# Patient Record
Sex: Female | Born: 1950 | Race: White | Hispanic: No | Marital: Single | Smoking: Never smoker
Health system: Southern US, Community
[De-identification: ages and names within clinical notes are randomized; demographics above are authoritative.]

## PROBLEM LIST (undated history)

## (undated) DIAGNOSIS — Z789 Other specified health status: Secondary | ICD-10-CM

## (undated) DIAGNOSIS — I8390 Asymptomatic varicose veins of unspecified lower extremity: Secondary | ICD-10-CM

## (undated) HISTORY — PX: COLONOSCOPY: SHX174

---

## 1998-06-21 HISTORY — PX: TONSILLECTOMY: SUR1361

## 2007-08-23 ENCOUNTER — Ambulatory Visit: Payer: Self-pay | Admitting: Vascular Surgery

## 2007-09-08 ENCOUNTER — Ambulatory Visit: Payer: Self-pay | Admitting: Vascular Surgery

## 2007-11-23 ENCOUNTER — Ambulatory Visit: Payer: Self-pay | Admitting: Vascular Surgery

## 2009-08-19 LAB — HM MAMMOGRAPHY

## 2010-11-03 NOTE — Consult Note (Signed)
NEW PATIENT CONSULTATION   Abigail Miller, Abigail Miller  DOB:  Apr 12, 1951                                       08/23/2007  ZOXWR#:[id number]   Abigail Miller presents today for evaluation of pain in her right leg.  She reports complex throbbing pain and also itching in her right leg.  She does have some superficial reticular veins over her right calf ankle  and pretibial area.  She reports that the itching is specifically  related to the area of these veins.  She reports that she first noticed  venous pathology after her third pregnancy 19 years ago.  She does have  some aching sensation associated with this from her right knee distally.  She does not have any similar symptoms in her left leg.  She has no  significant swelling or edema.  She does not have any history of deep  venous thrombosis or any hemorrhage from her veins.   MEDICAL HISTORY:  Significant for:  1. Skin cancer behind her right ear which was resected.  2. She has no history of major medical difficulties, specifically no      diabetes, hypertension, elevated cholesterol or cardiovascular      disorders.  3. She has had cesarean section x3.   Her only medication is Prempro.  She has no known drug allergies.   SOCIAL HISTORY:  1. She is married with 3 children.  2. She works as a Doctor, general practice.  3. She does not smoke or drink alcohol.   REVIEW OF SYSTEMS:  Is otherwise negative.   PHYSICAL EXAM:  She is a well-developed, well-nourished white female  appearing stated age of 15.  Her blood pressure is 146/92, heart rate is  84, respirations 18.  Her dorsalis pedis pulses are 2+ bilaterally.  General:  She is a thin, white female in no acute distress.  She does  have spider vein telangiectasia bilaterally and does have reticular  varicosities over the pretibial area on the right.  She underwent a  handheld screening Duplex by me and this did not show any obvious gross  reflux in her saphenous vein.  I  have recommended a formal Duplex for  further evaluation.  She was not able to wait to have this done today in  our office, so we will reschedule this at her convenience and I will see  her again at that time to discuss this.  I did explain options with her  depending on the results of the ultrasound.  We will make further  recommendations, pending this study, on her next visit.   Larina Earthly, M.D.  Electronically Signed   TFE/MEDQ  D:  08/23/2007  T:  08/24/2007  Job:  [id number]   cc:   Amy Y. [place], M.D.

## 2010-11-03 NOTE — Procedures (Signed)
LOWER EXTREMITY VENOUS REFLUX EXAM   INDICATION:  Right calf varicose veins.  Left leg is asymptomatic at  this time.   EXAM:  Using color-flow imaging and pulse Doppler spectral analysis, the  right and left common femoral, superficial femoral, popliteal, posterior  tibial, greater and lesser saphenous veins are evaluated.  There is no  evidence suggesting deep venous insufficiency in the right and left  lower extremity.   The right and left saphenofemoral junctions are competent.  The right  and left GSV are competent.   The right proximal short saphenous vein demonstrates incompetence.  Left  short saphenous vein was competent.   GSV Diameter (used if found to be incompetent only)                                            Right    Left  Proximal Greater Saphenous Vein           cm       cm  Proximal-to-mid-thigh                     cm       cm  Mid thigh                                 cm       cm  Mid-distal thigh                          cm       cm  Distal thigh                              cm       cm  Knee                                      cm       cm   IMPRESSION:  1. No greater saphenous vein reflux is identified.  2. The deep venous system is competent.  3. The right short saphenous vein is not competent.  4. The left short saphenous vein is competent.   ___________________________________________  Larina Earthly, M.D.   DP/MEDQ  D:  09/08/2007  T:  09/08/2007  Job:  [id number]

## 2010-11-03 NOTE — Assessment & Plan Note (Signed)
OFFICE VISIT   Abigail Miller, Abigail Miller  DOB:  [birth year]                                       09/08/2007  ZOXWR#:[id number]   Ulyana Pitones presents today for continued followup of her venous  difficulties.  I have seen her for initial evaluation on 08/23/2007.  She is here today with bilateral venous duplex for further evaluation of  her vascular anatomy.  On my screening ultrasound, at the time of her  initial visit, I did not see any evidence of gross saphenous vein  reflux.  She continues to have difficulty with mainly two components of  her discomfort.  She has total leg throbbing and does report some  numbness in some positions of sleeping in her right leg only.  She also  has severe itching over the distal calf down to her ankle.  This is in  an area of a large amount of spider vein telangiectasia.  I reviewed her  venous duplex with her, this shows no evidence of great saphenous vein  reflux, her right small saphenous vein is also competent; she does have  some reflux in her left small saphenous vein.  Her deep venous system  shows no evidence of obstruction or incompetence.  I discussed this at  length with Ms. Muenchow.  I have no explanation for her right total leg  throbbing and aching sensation.  I suspect that this could be related to  either neurogenic or orthopedic causes.  Regarding the itching over the  telangiectasia, I explained that it is highly unusual to see this.  She  reports that the itching and the telangiectasia appeared at the same  time, it is only in this region.  I feel it is reasonable to proceed  with sclerotherapy to determine if she can have some relief of this.  I  described the procedure as an outpatient procedure.  I explained that  insurance would not cover this and explained the approximate cost for  this as well.  She wishes to proceed with this at her convenience.   Larina Earthly, M.D.  Electronically Signed   TFE/MEDQ  D:   09/08/2007  T:  09/11/2007  Job:  [id number]   cc:   Amy Y. [place], M.D.

## 2011-11-26 ENCOUNTER — Other Ambulatory Visit: Payer: Self-pay | Admitting: Orthopedic Surgery

## 2011-11-29 ENCOUNTER — Encounter (HOSPITAL_BASED_OUTPATIENT_CLINIC_OR_DEPARTMENT_OTHER): Payer: Self-pay | Admitting: *Deleted

## 2011-11-29 NOTE — Progress Notes (Signed)
No labs needed No PCP

## 2011-12-03 ENCOUNTER — Encounter (HOSPITAL_BASED_OUTPATIENT_CLINIC_OR_DEPARTMENT_OTHER): Payer: Self-pay | Admitting: *Deleted

## 2011-12-03 ENCOUNTER — Encounter (HOSPITAL_BASED_OUTPATIENT_CLINIC_OR_DEPARTMENT_OTHER): Payer: Self-pay | Admitting: Certified Registered Nurse Anesthetist

## 2011-12-03 ENCOUNTER — Encounter (HOSPITAL_BASED_OUTPATIENT_CLINIC_OR_DEPARTMENT_OTHER): Payer: Self-pay | Admitting: Orthopedic Surgery

## 2011-12-03 ENCOUNTER — Ambulatory Visit (HOSPITAL_BASED_OUTPATIENT_CLINIC_OR_DEPARTMENT_OTHER)
Admission: RE | Admit: 2011-12-03 | Discharge: 2011-12-03 | Disposition: A | Discharge status: Hospice | Payer: BC Managed Care – PPO | Source: Ambulatory Visit | Attending: Orthopedic Surgery | Admitting: Orthopedic Surgery

## 2011-12-03 ENCOUNTER — Ambulatory Visit (HOSPITAL_BASED_OUTPATIENT_CLINIC_OR_DEPARTMENT_OTHER): Payer: BC Managed Care – PPO | Admitting: Certified Registered Nurse Anesthetist

## 2011-12-03 ENCOUNTER — Encounter (HOSPITAL_BASED_OUTPATIENT_CLINIC_OR_DEPARTMENT_OTHER)
Admission: RE | Disposition: A | Discharge status: Hospice | Payer: Self-pay | Source: Ambulatory Visit | Attending: Orthopedic Surgery

## 2011-12-03 DIAGNOSIS — G5600 Carpal tunnel syndrome, unspecified upper limb: Secondary | ICD-10-CM | POA: Insufficient documentation

## 2011-12-03 DIAGNOSIS — M65839 Other synovitis and tenosynovitis, unspecified forearm: Secondary | ICD-10-CM | POA: Insufficient documentation

## 2011-12-03 DIAGNOSIS — M654 Radial styloid tenosynovitis [de Quervain]: Secondary | ICD-10-CM | POA: Insufficient documentation

## 2011-12-03 HISTORY — PX: TRIGGER FINGER RELEASE: SHX641

## 2011-12-03 HISTORY — DX: Other specified health status: Z78.9

## 2011-12-03 HISTORY — PX: DORSAL COMPARTMENT RELEASE: SHX5039

## 2011-12-03 HISTORY — DX: Asymptomatic varicose veins of unspecified lower extremity: I83.90

## 2011-12-03 HISTORY — PX: CARPAL TUNNEL RELEASE: SHX101

## 2011-12-03 SURGERY — CARPAL TUNNEL RELEASE
Anesthesia: General | Site: Wrist | Laterality: Right | Wound class: Clean

## 2011-12-03 MED ORDER — ONDANSETRON HCL 4 MG/2ML IJ SOLN
INTRAMUSCULAR | Status: DC | PRN
  Administered 2011-12-03: 4 mg via INTRAVENOUS

## 2011-12-03 MED ORDER — FENTANYL CITRATE 0.05 MG/ML IJ SOLN
INTRAMUSCULAR | Status: DC | PRN
  Administered 2011-12-03: 50 ug via INTRAVENOUS
  Administered 2011-12-03: 25 ug via INTRAVENOUS

## 2011-12-03 MED ORDER — FENTANYL CITRATE 0.05 MG/ML IJ SOLN
25.0000 ug | INTRAMUSCULAR | Status: DC | PRN
  Administered 2011-12-03 (×2): 25 ug via INTRAVENOUS
  Administered 2011-12-03: 50 ug via INTRAVENOUS

## 2011-12-03 MED ORDER — OXYCODONE HCL 5 MG PO TABS: 5.0000 mg | ORAL_TABLET | Freq: Once | ORAL | Status: DC | PRN

## 2011-12-03 MED ORDER — LACTATED RINGERS IV SOLN
INTRAVENOUS | Status: DC
  Administered 2011-12-03 (×2): via INTRAVENOUS

## 2011-12-03 MED ORDER — CHLORHEXIDINE GLUCONATE 4 % EX LIQD: 60.0000 mL | Freq: Once | CUTANEOUS | Status: DC

## 2011-12-03 MED ORDER — OXYCODONE-ACETAMINOPHEN 5-325 MG PO TABS: ORAL_TABLET | ORAL | Status: AC

## 2011-12-03 MED ORDER — LIDOCAINE HCL (CARDIAC) 20 MG/ML IV SOLN
INTRAVENOUS | Status: DC | PRN
  Administered 2011-12-03: 60 mg via INTRAVENOUS

## 2011-12-03 MED ORDER — PROPOFOL 10 MG/ML IV EMUL
INTRAVENOUS | Status: DC | PRN
  Administered 2011-12-03: 200 mg via INTRAVENOUS

## 2011-12-03 MED ORDER — METOCLOPRAMIDE HCL 5 MG/ML IJ SOLN
INTRAMUSCULAR | Status: DC | PRN
  Administered 2011-12-03: 10 mg via INTRAVENOUS

## 2011-12-03 MED ORDER — DEXAMETHASONE SODIUM PHOSPHATE 10 MG/ML IJ SOLN
INTRAMUSCULAR | Status: DC | PRN
  Administered 2011-12-03: 10 mg via INTRAVENOUS

## 2011-12-03 MED ORDER — LIDOCAINE HCL 2 % IJ SOLN
INTRAMUSCULAR | Status: DC | PRN
  Administered 2011-12-03: 5 mL

## 2011-12-03 MED ORDER — METOCLOPRAMIDE HCL 5 MG/ML IJ SOLN: 10.0000 mg | Freq: Once | INTRAMUSCULAR | Status: DC | PRN

## 2011-12-03 MED ORDER — MIDAZOLAM HCL 5 MG/5ML IJ SOLN
INTRAMUSCULAR | Status: DC | PRN
  Administered 2011-12-03: 2 mg via INTRAVENOUS

## 2011-12-03 MED ORDER — OXYCODONE-ACETAMINOPHEN 5-325 MG PO TABS
1.0000 | ORAL_TABLET | Freq: Once | ORAL | Status: AC
  Administered 2011-12-03: 1 via ORAL

## 2011-12-03 SURGICAL SUPPLY — 50 items
BANDAGE ADHESIVE 1X3 (GAUZE/BANDAGES/DRESSINGS) IMPLANT
BANDAGE ELASTIC 3 VELCRO ST LF (GAUZE/BANDAGES/DRESSINGS) ×4 IMPLANT
BLADE MINI RND TIP GREEN BEAV (BLADE) IMPLANT
BLADE SURG 15 STRL LF DISP TIS (BLADE) ×3 IMPLANT
BLADE SURG 15 STRL SS (BLADE) ×1
BNDG ELASTIC 2 VLCR STRL LF (GAUZE/BANDAGES/DRESSINGS) ×4 IMPLANT
BNDG ESMARK 4X9 LF (GAUZE/BANDAGES/DRESSINGS) ×4 IMPLANT
BRUSH SCRUB EZ PLAIN DRY (MISCELLANEOUS) ×4 IMPLANT
CLOTH BEACON ORANGE TIMEOUT ST (SAFETY) ×4 IMPLANT
CORDS BIPOLAR (ELECTRODE) ×4 IMPLANT
COVER MAYO STAND STRL (DRAPES) ×4 IMPLANT
COVER TABLE BACK 60X90 (DRAPES) ×4 IMPLANT
CUFF TOURNIQUET SINGLE 18IN (TOURNIQUET CUFF) ×4 IMPLANT
DECANTER SPIKE VIAL GLASS SM (MISCELLANEOUS) ×4 IMPLANT
DRAPE EXTREMITY T 121X128X90 (DRAPE) ×4 IMPLANT
DRAPE SURG 17X23 STRL (DRAPES) ×4 IMPLANT
DRSG TEGADERM 4X4.75 (GAUZE/BANDAGES/DRESSINGS) IMPLANT
GAUZE SPONGE 4X4 12PLY STRL LF (GAUZE/BANDAGES/DRESSINGS) ×8 IMPLANT
GAUZE XEROFORM 1X8 LF (GAUZE/BANDAGES/DRESSINGS) ×4 IMPLANT
GLOVE BIO SURGEON STRL SZ7 (GLOVE) ×8 IMPLANT
GLOVE BIOGEL M STRL SZ7.5 (GLOVE) IMPLANT
GLOVE BIOGEL PI IND STRL 7.5 (GLOVE) ×6 IMPLANT
GLOVE BIOGEL PI INDICATOR 7.5 (GLOVE) ×2
GLOVE ORTHO TXT STRL SZ7.5 (GLOVE) ×4 IMPLANT
GOWN PREVENTION PLUS XLARGE (GOWN DISPOSABLE) ×4 IMPLANT
GOWN PREVENTION PLUS XXLARGE (GOWN DISPOSABLE) ×8 IMPLANT
GOWN STRL REIN XL XLG (GOWN DISPOSABLE) ×8 IMPLANT
NEEDLE 27GAX1X1/2 (NEEDLE) ×4 IMPLANT
PACK BASIN DAY SURGERY FS (CUSTOM PROCEDURE TRAY) ×4 IMPLANT
PAD CAST 3X4 CTTN HI CHSV (CAST SUPPLIES) ×3 IMPLANT
PAD CAST 4YDX4 CTTN HI CHSV (CAST SUPPLIES) ×3 IMPLANT
PADDING CAST ABS 4INX4YD NS (CAST SUPPLIES) ×1
PADDING CAST ABS COTTON 4X4 ST (CAST SUPPLIES) ×3 IMPLANT
PADDING CAST COTTON 3X4 STRL (CAST SUPPLIES) ×1
PADDING CAST COTTON 4X4 STRL (CAST SUPPLIES) ×1
SLEEVE SCD COMPRESS KNEE MED (MISCELLANEOUS) IMPLANT
SPLINT PLASTER CAST XFAST 3X15 (CAST SUPPLIES) ×15 IMPLANT
SPLINT PLASTER XTRA FASTSET 3X (CAST SUPPLIES) ×5
SPONGE GAUZE 4X4 12PLY (GAUZE/BANDAGES/DRESSINGS) ×4 IMPLANT
STOCKINETTE 4X48 STRL (DRAPES) ×4 IMPLANT
STRIP CLOSURE SKIN 1/2X4 (GAUZE/BANDAGES/DRESSINGS) ×4 IMPLANT
SUT PROLENE 3 0 PS 2 (SUTURE) ×4 IMPLANT
SUT VIC AB 4-0 P-3 18XBRD (SUTURE) IMPLANT
SUT VIC AB 4-0 P3 18 (SUTURE)
SYR 3ML 23GX1 SAFETY (SYRINGE) IMPLANT
SYR CONTROL 10ML LL (SYRINGE) ×4 IMPLANT
TOWEL OR 17X24 6PK STRL BLUE (TOWEL DISPOSABLE) ×4 IMPLANT
TRAY DSU PREP LF (CUSTOM PROCEDURE TRAY) ×4 IMPLANT
UNDERPAD 30X30 INCONTINENT (UNDERPADS AND DIAPERS) ×4 IMPLANT
WATER STERILE IRR 1000ML POUR (IV SOLUTION) ×4 IMPLANT

## 2011-12-03 NOTE — Anesthesia Postprocedure Evaluation (Signed)
Anesthesia Post Note  Patient: Abigail Miller  Procedure(s) Performed: Procedure(s) (LRB): CARPAL TUNNEL RELEASE (Right) RELEASE TRIGGER FINGER/A-1 PULLEY (Right) RELEASE DORSAL COMPARTMENT (DEQUERVAIN) (Right)  Anesthesia type: General  Patient location: PACU  Post pain: Pain level controlled  Post assessment: Patient's Cardiovascular Status Stable  Last Vitals:  Filed Vitals:   12/03/11 1055  BP: 135/80  Pulse:   Temp: 36.4 C  Resp: 14    Post vital signs: Reviewed and stable  Level of consciousness: alert  Complications: No apparent anesthesia complications

## 2011-12-03 NOTE — H&P (Signed)
Abigail Miller is an 61 y.o. female.   Chief Complaint: numb right hand, locking thumb and mass with pain radial wrist HPI: chronic numbness right greater than left with positive NCV. sts of thumb and first dorsal compartment  Past Medical History  Diagnosis Date  . No pertinent past medical history   . Vein, varicose     Past Surgical History  Procedure Date  . Colonoscopy   . Tonsillectomy   . Cesarean section     x3    History reviewed. No pertinent family history. Social History:  reports that she has never smoked. She does not have any smokeless tobacco history on file. She reports that she does not drink alcohol or use illicit drugs.  Allergies:  Allergies  Allergen Reactions  . Sulfa Antibiotics Rash    Medications Prior to Admission  Medication Sig Dispense Refill  . acetaminophen (TYLENOL) 325 MG tablet Take 650 mg by mouth every 6 (six) hours as needed.        No results found for this or any previous visit (from the past 48 hour(s)). No results found.  Review of Systems  Constitutional: Negative.   HENT: Negative.   Eyes: Negative.   Respiratory: Negative.   Cardiovascular: Negative.   Gastrointestinal: Negative.   Genitourinary: Negative.   Musculoskeletal: Positive for joint pain.  Skin: Negative.   Neurological: Positive for tingling.  Endo/Heme/Allergies: Negative.   Psychiatric/Behavioral: Negative.     Blood pressure 146/98, pulse 96, temperature 98.4 F (36.9 C), temperature source Oral, resp. rate 16, height 5\' 3"  (1.6 m), weight 54.432 kg (120 lb), SpO2 96.00%. Physical Exam  Constitutional: She appears well-developed and well-nourished.  HENT:  Head: Normocephalic and atraumatic.  Eyes: Pupils are equal, round, and reactive to light.  Neck: Normal range of motion. Neck supple.  Cardiovascular: Normal rate and regular rhythm.   Respiratory: Effort normal and breath sounds normal.  GI: Soft.  Musculoskeletal: Normal range of motion.    Swelling at right first dorsal compartment Locking right thumb and chronic numbness median distribution  Skin: Skin is warm.  Psychiatric: She has a normal mood and affect.     Assessment/Plan right carpal tunnel syndrome Right first dorsal compartment sts and thumb sts  Right carpal tunnel release , trigger thumb release and first dorsal compartment release Terriona Horlacher JR,Kimble Hitchens V 12/03/2011, 8:26 AM

## 2011-12-03 NOTE — Transfer of Care (Signed)
Immediate Anesthesia Transfer of Care Note  Patient: Abigail Miller  Procedure(s) Performed: Procedure(s) (LRB): CARPAL TUNNEL RELEASE (Right) RELEASE TRIGGER FINGER/A-1 PULLEY (Right) RELEASE DORSAL COMPARTMENT (DEQUERVAIN) (Right)  Patient Location: PACU  Anesthesia Type: General  Level of Consciousness: awake and patient cooperative  Airway & Oxygen Therapy: Patient Spontanous Breathing and Patient connected to face mask oxygen  Post-op Assessment: Report given to PACU RN and Post -op Vital signs reviewed and stable  Post vital signs: Reviewed and stable  Complications: No apparent anesthesia complications

## 2011-12-03 NOTE — Brief Op Note (Signed)
12/03/2011  9:21 AM  PATIENT:  Prescott Parma  61 y.o. female  PRE-OPERATIVE DIAGNOSIS:  right carpal tunnel syndrome, trigger thumb, dequervains  POST-OPERATIVE DIAGNOSIS:  same as preop  PROCEDURE:  Procedure(s) (LRB): CARPAL TUNNEL RELEASE (Right) RELEASE TRIGGER THUMB/A-1 PULLEY  RIGHT THUMB RELEASE DORSAL COMPARTMENT (DEQUERVAIN) (Right)  SURGEON:  Surgeon(s) and Role:    * Wyn Forster., MD - Primary  PHYSICIAN ASSISTANT:   ASSISTANTS: NURSE   ANESTHESIA:   general  EBL:  Total I/O In: 1000 [I.V.:1000] Out: -   BLOOD ADMINISTERED:none  DRAINS: none   LOCAL MEDICATIONS USED:  LIDOCAINE   SPECIMEN:  No Specimen  DISPOSITION OF SPECIMEN:  N/A  COUNTS:  YES  TOURNIQUET:   Total Tourniquet Time Documented: Upper Arm (Right) - 24 minutes  DICTATION: .Other Dictation: Dictation Number [id number]  PLAN OF CARE: Discharge to home after PACU  PATIENT DISPOSITION:  PACU - hemodynamically stable.

## 2011-12-03 NOTE — Op Note (Signed)
[id number] 

## 2011-12-03 NOTE — Anesthesia Procedure Notes (Signed)
Procedure Name: LMA Insertion Date/Time: 12/03/2011 8:45 AM Performed by: Burke Terry D Pre-anesthesia Checklist: Patient identified, Emergency Drugs available, Suction available and Patient being monitored Patient Re-evaluated:Patient Re-evaluated prior to inductionOxygen Delivery Method: Circle System Utilized Preoxygenation: Pre-oxygenation with 100% oxygen Intubation Type: IV induction Ventilation: Mask ventilation without difficulty LMA: LMA inserted LMA Size: 4.0 Number of attempts: 1 Airway Equipment and Method: bite block Placement Confirmation: positive ETCO2 Tube secured with: Tape Dental Injury: Teeth and Oropharynx as per pre-operative assessment

## 2011-12-03 NOTE — Op Note (Signed)
NAMEJHANE, LORIO NO.:  [id number]  MEDICAL RECORD NO.:  [id number]  LOCATION:                                 FACILITY:  PHYSICIAN:  Katy Fitch. Amiera Herzberg, M.D.      DATE OF BIRTH:  DATE OF PROCEDURE:  12/03/2011 DATE OF DISCHARGE:                              OPERATIVE REPORT   PREOPERATIVE DIAGNOSES: 1. Entrapped neuropathy, median nerve, right carpal tunnel, right     first dorsal compartment stenosing tenosynovitis with separate     compartment for extensor pollicis brevis. 2. Chronic stenosing tenosynovitis, right thumb with     fibrocartilaginous nodule, flexor pollicis longus.  OPERATION: 1. Release of right first dorsal compartment with septum resection. 2. Release of right thumb A1 pulley with removal of fibrocartilaginous     nodule from flexor pollicis longus. 3. Release of right transcarpal ligament.  OPERATING SURGEON:  Katy Fitch. Einar Nolasco, M.D.  ASSISTANT:  Nurse.  ANESTHESIA:  General by LMA.  SUPERVISING ANESTHESIOLOGIST:  Janetta Hora. 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  Clinical examination confirmed the presence of carpal tunnel syndrome, first dorsal compartment stenosing tenosynovitis and trigger thumb.  She has failed nonoperative measures including splinting activity modification and steroid injection.  She is now devised to present to the operating room for release of her transverse carpal ligament, release of the first dorsal compartment, and release of the thumb A1 pulley.  She was reexamined in the holding area and the pathology confirmed.  Questions were invited and answered in detail.  PROCEDURE:  Casaundra Takacs is brought to room #1 of the [facility], placed in supine position on the operating  table.  Following induction of general anesthesia by LMA technique, the right arm was prepped with Betadine soap and solution, sterilely draped.  Following exsanguination of right arm with Esmarch bandage, the arterial tourniquet was inflated to 220 mmHg.  The procedure commenced with a short incision in the line of the ring finger and the palm. Subcutaneous tissues were carefully divided revealing the palmar fascia. This was split longitudinally to reveal the common sensory branch of the median nerve.  These were followed back to the median nerve proper which was then gently isolated from the transverse carpal ligament with the aid of a [place] 4 Engineer, structural.  A pathway was created superficial to the nerve deep to the ligament into the distal forearm.  The ligament was then released along its ulnar border with scissors extending into the distal forearm.  This widely opened carpal canal.  No mass or other predicaments were noted.  Bleeding points along the margin of released ligament were not problematic.  The wound was then repaired with intradermal 3-0 Prolene suture.  Attention was then directed to the thumb.  A short transverse incision was placed directly over the palpably thickened A1 pulley.  Subcutaneous tissues were quite fibrotic.  These releases scissors revealing a very swollen A1 pulley.  This was split with scalpel and scissors.  The flexor  pollicis longus was delivered and a fibrocartilaginous nodule measuring approximately 4 mm in length, 2 mm in width and 2 mm in height was excised with a micro rongeur.  Thereafter free range of motion of the thumb IP joint was recovered.  This wound was then repaired with intradermal 3-0 Prolene suture.  Attention was then directed to the radial aspect of the wrist.  A short transverse incision was fashioned directly over the palpably thickened first dorsal compartment.  Subcutaneous tissues were carefully divided revealing steroid  within the wall of the compartment from prior injection.  This was removed with a rongeur.  The radial superficial sensory branches were gently dissected and retracted with Ragnell retractors.  The A1 pulley was cleared of all soft tissues, released with scalpel and scissors.  There were 2 slips of the abductor pollicis longus and an extensor pollicis brevis that had a separate dorsal compartment.  The compartment was incised and the septum removed. Thereafter the wound was repaired with intradermal 3-0 Prolene.  All 3 wounds were infiltrated with 2% lidocaine for postoperative comfort.  Steri-Strips were applied.  Compressive dressing was applied with a volar plaster splint maintaining the wrist in 15 degrees of dorsiflexion.  No apparent complications.  For aftercare, Ms. Lecount was provided with prescription for Percocet 5 mg 1 p.o. q.4-6 hours p.r.n. pain, 20 tablets, without refill.  We will see her back in followup in 1 week for dressing change, suture removal, and advancement to an exercise program.     Katy Fitch. Amor Hyle, M.D.     RVS/MEDQ  D:  12/03/2011  T:  12/03/2011  Job:  [id number]

## 2011-12-03 NOTE — Anesthesia Preprocedure Evaluation (Signed)
Anesthesia Evaluation  Patient identified by MRN, date of birth, ID band Patient awake    Reviewed: Allergy & Precautions, H&P , NPO status , Patient's Chart, lab work & pertinent test results, reviewed documented beta blocker date and time   Airway Mallampati: II TM Distance: >3 FB Neck ROM: full    Dental   Pulmonary neg pulmonary ROS,          Cardiovascular negative cardio ROS      Neuro/Psych negative neurological ROS  negative psych ROS   GI/Hepatic negative GI ROS, Neg liver ROS,   Endo/Other  negative endocrine ROS  Renal/GU negative Renal ROS  negative genitourinary   Musculoskeletal   Abdominal   Peds  Hematology negative hematology ROS (+)   Anesthesia Other Findings See surgeon's H&P   Reproductive/Obstetrics negative OB ROS                           Anesthesia Physical Anesthesia Plan  ASA: I  Anesthesia Plan: General   Post-op Pain Management:    Induction: Intravenous  Airway Management Planned: LMA  Additional Equipment:   Intra-op Plan:   Post-operative Plan: Extubation in OR  Informed Consent: I have reviewed the patients History and Physical, chart, labs and discussed the procedure including the risks, benefits and alternatives for the proposed anesthesia with the patient or authorized representative who has indicated his/her understanding and acceptance.   Dental Advisory Given  Plan Discussed with: CRNA and Surgeon  Anesthesia Plan Comments:         Anesthesia Quick Evaluation  

## 2011-12-03 NOTE — Discharge Instructions (Signed)
HAND SURGERY ° °  HOME CARE INSTRUCTIONS ° ° ° °The following instructions have been prepared to help you care for yourself upon your return home today. ° °Wound Care:  °Keep your hand elevated above the level of your heart. Do not allow it to dangle by your side. Keep the dressing dry and do not remove it unless your doctor advises you to do so. He will usually change it at the time of you post-op visit. Moving your fingers is advised to stimulate circulation but will depend on the site of your surgery. Of course, if you have a splint applied your doctor will advise you about movement. ° °Activity:  °Do not drive or operate machinery today. Rest today and then you may return to your normal activity and work as indicated by your physician. ° °Diet: °Drink liquids today or eat a light diet. You may resume a regular diet tomorrow. ° °General expectations: °Pain for two or three days. °Fingers may become slightly swollen.  ° °Unexpected Observations- Call your doctor if any of these occur: °Severe pain not relieved by pain medication. °Elevated temperature. °Dressing soaked with blood. °Inability to move fingers. °White or bluish color to fingers. ° ° °Post Anesthesia Home Care Instructions ° °Activity: °Get plenty of rest for the remainder of the day. A responsible adult should stay with you for 24 hours following the procedure.  °For the next 24 hours, DO NOT: °-Drive a car °-Operate machinery °-Drink alcoholic beverages °-Take any medication unless instructed by your physician °-Make any legal decisions or sign important papers. ° °Meals: °Start with liquid foods such as gelatin or soup. Progress to regular foods as tolerated. Avoid greasy, spicy, heavy foods. If nausea and/or vomiting occur, drink only clear liquids until the nausea and/or vomiting subsides. Call your physician if vomiting continues. ° °Special Instructions/Symptoms: °Your throat may feel dry or sore from the anesthesia or the breathing tube  placed in your throat during surgery. If this causes discomfort, gargle with warm salt water. The discomfort should disappear within 24 hours. ° ° ° ° °

## 2011-12-08 ENCOUNTER — Encounter (HOSPITAL_BASED_OUTPATIENT_CLINIC_OR_DEPARTMENT_OTHER): Payer: Self-pay | Admitting: Orthopedic Surgery

## 2012-10-16 ENCOUNTER — Ambulatory Visit (HOSPITAL_BASED_OUTPATIENT_CLINIC_OR_DEPARTMENT_OTHER)
Admission: RE | Admit: 2012-10-16 | Discharge: 2012-10-16 | Disposition: A | Discharge status: Home / Self Care | Payer: BC Managed Care – PPO | Source: Ambulatory Visit | Attending: Family Medicine | Admitting: Family Medicine

## 2012-10-16 ENCOUNTER — Encounter: Payer: Self-pay | Admitting: Family Medicine

## 2012-10-16 ENCOUNTER — Ambulatory Visit (INDEPENDENT_AMBULATORY_CARE_PROVIDER_SITE_OTHER): Payer: BC Managed Care – PPO | Admitting: Family Medicine

## 2012-10-16 VITALS — BP 120/80 | HR 112 | Temp 98.3°F | Ht 63.0 in | Wt 127.8 lb

## 2012-10-16 DIAGNOSIS — W19XXXA Unspecified fall, initial encounter: Secondary | ICD-10-CM | POA: Insufficient documentation

## 2012-10-16 DIAGNOSIS — M25572 Pain in left ankle and joints of left foot: Secondary | ICD-10-CM

## 2012-10-16 DIAGNOSIS — M25579 Pain in unspecified ankle and joints of unspecified foot: Secondary | ICD-10-CM

## 2012-10-16 DIAGNOSIS — Y929 Unspecified place or not applicable: Secondary | ICD-10-CM | POA: Insufficient documentation

## 2012-10-16 MED ORDER — TRAMADOL HCL 50 MG PO TABS: 50.0000 mg | ORAL_TABLET | Freq: Three times a day (TID) | ORAL | Status: DC | PRN

## 2012-10-16 NOTE — Progress Notes (Signed)
  Subjective:    Abigail Miller is a 62 y.o. female who presents with left ankle pain. Onset of the symptoms was several days ago. Inciting event: pt stepped in a hole while running with the dog. Current symptoms include: bruising, inability to bear weight and swelling. Aggravating factors: direct pressure, standing, walking  and weight bearing. Symptoms have gradually worsened. Patient has had no prior ankle problems. Evaluation to date: none. Treatment to date: none. The following portions of the patient's history were reviewed and updated as appropriate: allergies, current medications, past family history, past medical history, past social history, past surgical history and problem list.    Objective:    BP 120/80  Pulse 112  Temp(Src) 98.3 F (36.8 C) (Oral)  Ht 5\' 3"  (1.6 m)  Wt 127 lb 12.8 oz (57.97 kg)  BMI 22.64 kg/m2  SpO2 96% Right ankle:   normal  Left ankle:   ecchymosis noted across metatarsal phlanges, and ant low leg negative findings: no erythema positive findings: decreased dorsiflexion, tenderness lat malleous and up low leg   Imaging: X-ray of the left ankle(s): not available    Assessment:    Ankle sprain    Plan:    Natural history and expected course discussed. Questions answered. Rest, ice, compression, elevation (RICE) therapy. Fit with ankle brace for use over next several days. x ray ordered

## 2012-10-16 NOTE — Patient Instructions (Addendum)
Ankle Sprain  An ankle sprain is an injury to the strong, fibrous tissues (ligaments) that hold the bones of your ankle joint together.   CAUSES  An ankle sprain is usually caused by a fall or by twisting your ankle. Ankle sprains most commonly occur when you step on the outer edge of your foot, and your ankle turns inward. People who participate in sports are more prone to these types of injuries.   SYMPTOMS    Pain in your ankle. The pain may be present at rest or only when you are trying to stand or walk.   Swelling.   Bruising. Bruising may develop immediately or within 1 to 2 days after your injury.   Difficulty standing or walking, particularly when turning corners or changing directions.  DIAGNOSIS   Your caregiver will ask you details about your injury and perform a physical exam of your ankle to determine if you have an ankle sprain. During the physical exam, your caregiver will press on and apply pressure to specific areas of your foot and ankle. Your caregiver will try to move your ankle in certain ways. An X-ray exam may be done to be sure a bone was not broken or a ligament did not separate from one of the bones in your ankle (avulsion fracture).   TREATMENT   Certain types of braces can help stabilize your ankle. Your caregiver can make a recommendation for this. Your caregiver may recommend the use of medicine for pain. If your sprain is severe, your caregiver may refer you to a surgeon who helps to restore function to parts of your skeletal system (orthopedist) or a physical therapist.  HOME CARE INSTRUCTIONS    Apply ice to your injury for 1 to 2 days or as directed by your caregiver. Applying ice helps to reduce inflammation and pain.   Put ice in a plastic bag.   Place a towel between your skin and the bag.   Leave the ice on for 15 to 20 minutes at a time, every 2 hours while you are awake.   Only take over-the-counter or prescription medicines for pain, discomfort, or fever as directed  by your caregiver.   Keep your injured leg elevated, when possible, to lessen swelling.   If your caregiver recommends crutches, use them as instructed. Gradually put weight on the affected ankle. Continue to use crutches or a cane until you can walk without feeling pain in your ankle.   If you have a plaster splint, wear the splint as directed by your caregiver. Do not rest it on anything harder than a pillow for the first 24 hours. Do not put weight on it. Do not get it wet. You may take it off to take a shower or bath.   You may have been given an elastic bandage to wear around your ankle to provide support. If the elastic bandage is too tight (you have numbness or tingling in your foot or your foot becomes cold and blue), adjust the bandage to make it comfortable.   If you have an air splint, you may blow more air into it or let air out to make it more comfortable. You may take your splint off at night and before taking a shower or bath.   Wiggle your toes in the splint several times per day to decrease swelling.  SEEK MEDICAL CARE IF:    You have an increase in bruising, swelling, or pain.   Your toes feel extremely cold   or you lose feeling in your foot.   Your pain is not relieved with medicine.  SEEK IMMEDIATE MEDICAL CARE IF:   Your toes are numb or blue.   You have severe pain.  MAKE SURE YOU:    Understand these instructions.   Will watch your condition.   Will get help right away if you are not doing well or get worse.  Document Released: 06/07/2005 Document Revised: 08/30/2011 Document Reviewed: 06/19/2011  [employer] Patient Information 2013 [employer].

## 2012-12-04 ENCOUNTER — Encounter: Payer: Self-pay | Admitting: Lab

## 2012-12-05 ENCOUNTER — Encounter: Payer: Self-pay | Admitting: Family Medicine

## 2012-12-05 ENCOUNTER — Other Ambulatory Visit: Payer: Self-pay | Admitting: Family Medicine

## 2012-12-05 ENCOUNTER — Ambulatory Visit (INDEPENDENT_AMBULATORY_CARE_PROVIDER_SITE_OTHER): Payer: BC Managed Care – PPO | Admitting: Family Medicine

## 2012-12-05 VITALS — BP 118/72 | HR 110 | Temp 98.9°F | Ht 63.0 in | Wt 126.8 lb

## 2012-12-05 DIAGNOSIS — Z2911 Encounter for prophylactic immunotherapy for respiratory syncytial virus (RSV): Secondary | ICD-10-CM

## 2012-12-05 DIAGNOSIS — Z23 Encounter for immunization: Secondary | ICD-10-CM

## 2012-12-05 DIAGNOSIS — Z0000 Encounter for general adult medical examination without abnormal findings: Secondary | ICD-10-CM

## 2012-12-05 NOTE — Patient Instructions (Addendum)
Preventive Care for Adults, Female A healthy lifestyle and preventive care can promote health and wellness. Preventive health guidelines for women include the following key practices.  A routine yearly physical is a good way to check with your caregiver about your health and preventive screening. It is a chance to share any concerns and updates on your health, and to receive a thorough exam.  Visit your dentist for a routine exam and preventive care every 6 months. Brush your teeth twice a day and floss once a day. Good oral hygiene prevents tooth decay and gum disease.  The frequency of eye exams is based on your age, health, family medical history, use of contact lenses, and other factors. Follow your caregiver's recommendations for frequency of eye exams.  Eat a healthy diet. Foods like vegetables, fruits, whole grains, low-fat dairy products, and lean protein foods contain the nutrients you need without too many calories. Decrease your intake of foods high in solid fats, added sugars, and salt. Eat the right amount of calories for you.Get information about a proper diet from your caregiver, if necessary.  Regular physical exercise is one of the most important things you can do for your health. Most adults should get at least 150 minutes of moderate-intensity exercise (any activity that increases your heart rate and causes you to sweat) each week. In addition, most adults need muscle-strengthening exercises on 2 or more days a week.  Maintain a healthy weight. The body mass index (BMI) is a screening tool to identify possible weight problems. It provides an estimate of body fat based on height and weight. Your caregiver can help determine your BMI, and can help you achieve or maintain a healthy weight.For adults 20 years and older:  A BMI below 18.5 is considered underweight.  A BMI of 18.5 to 24.9 is normal.  A BMI of 25 to 29.9 is considered overweight.  A BMI of 30 and above is  considered obese.  Maintain normal blood lipids and cholesterol levels by exercising and minimizing your intake of saturated fat. Eat a balanced diet with plenty of fruit and vegetables. Blood tests for lipids and cholesterol should begin at age 20 and be repeated every 5 years. If your lipid or cholesterol levels are high, you are over 50, or you are at high risk for heart disease, you may need your cholesterol levels checked more frequently.Ongoing high lipid and cholesterol levels should be treated with medicines if diet and exercise are not effective.  If you smoke, find out from your caregiver how to quit. If you do not use tobacco, do not start.  If you are pregnant, do not drink alcohol. If you are breastfeeding, be very cautious about drinking alcohol. If you are not pregnant and choose to drink alcohol, do not exceed 1 drink per day. One drink is considered to be 12 ounces (355 mL) of beer, 5 ounces (148 mL) of wine, or 1.5 ounces (44 mL) of liquor.  Avoid use of street drugs. Do not share needles with anyone. Ask for help if you need support or instructions about stopping the use of drugs.  High blood pressure causes heart disease and increases the risk of stroke. Your blood pressure should be checked at least every 1 to 2 years. Ongoing high blood pressure should be treated with medicines if weight loss and exercise are not effective.  If you are 55 to 60 years old, ask your caregiver if you should take aspirin to prevent strokes.  Diabetes   screening involves taking a blood sample to check your fasting blood sugar level. This should be done once every 3 years, after age 45, if you are within normal weight and without risk factors for diabetes. Testing should be considered at a younger age or be carried out more frequently if you are overweight and have at least 1 risk factor for diabetes.  Breast cancer screening is essential preventive care for women. You should practice "breast  self-awareness." This means understanding the normal appearance and feel of your breasts and may include breast self-examination. Any changes detected, no matter how small, should be reported to a caregiver. Women in their 20s and 30s should have a clinical breast exam (CBE) by a caregiver as part of a regular health exam every 1 to 3 years. After age 40, women should have a CBE every year. Starting at age 40, women should consider having a mammography (breast X-ray test) every year. Women who have a family history of breast cancer should talk to their caregiver about genetic screening. Women at a high risk of breast cancer should talk to their caregivers about having magnetic resonance imaging (MRI) and a mammography every year.  The Pap test is a screening test for cervical cancer. A Pap test can show cell changes on the cervix that might become cervical cancer if left untreated. A Pap test is a procedure in which cells are obtained and examined from the lower end of the uterus (cervix).  Women should have a Pap test starting at age 21.  Between ages 21 and 29, Pap tests should be repeated every 2 years.  Beginning at age 30, you should have a Pap test every 3 years as long as the past 3 Pap tests have been normal.  Some women have medical problems that increase the chance of getting cervical cancer. Talk to your caregiver about these problems. It is especially important to talk to your caregiver if a new problem develops soon after your last Pap test. In these cases, your caregiver may recommend more frequent screening and Pap tests.  The above recommendations are the same for women who have or have not gotten the vaccine for human papillomavirus (HPV).  If you had a hysterectomy for a problem that was not cancer or a condition that could lead to cancer, then you no longer need Pap tests. Even if you no longer need a Pap test, a regular exam is a good idea to make sure no other problems are  starting.  If you are between ages 65 and 70, and you have had normal Pap tests going back 10 years, you no longer need Pap tests. Even if you no longer need a Pap test, a regular exam is a good idea to make sure no other problems are starting.  If you have had past treatment for cervical cancer or a condition that could lead to cancer, you need Pap tests and screening for cancer for at least 20 years after your treatment.  If Pap tests have been discontinued, risk factors (such as a new sexual partner) need to be reassessed to determine if screening should be resumed.  The HPV test is an additional test that may be used for cervical cancer screening. The HPV test looks for the virus that can cause the cell changes on the cervix. The cells collected during the Pap test can be tested for HPV. The HPV test could be used to screen women aged 30 years and older, and should   be used in women of any age who have unclear Pap test results. After the age of 30, women should have HPV testing at the same frequency as a Pap test.  Colorectal cancer can be detected and often prevented. Most routine colorectal cancer screening begins at the age of 50 and continues through age 75. However, your caregiver may recommend screening at an earlier age if you have risk factors for colon cancer. On a yearly basis, your caregiver may provide home test kits to check for hidden blood in the stool. Use of a small camera at the end of a tube, to directly examine the colon (sigmoidoscopy or colonoscopy), can detect the earliest forms of colorectal cancer. Talk to your caregiver about this at age 50, when routine screening begins. Direct examination of the colon should be repeated every 5 to 10 years through age 75, unless early forms of pre-cancerous polyps or small growths are found.  Hepatitis C blood testing is recommended for all people born from 1945 through 1965 and any individual with known risks for hepatitis C.  Practice  safe sex. Use condoms and avoid high-risk sexual practices to reduce the spread of sexually transmitted infections (STIs). STIs include gonorrhea, chlamydia, syphilis, trichomonas, herpes, HPV, and human immunodeficiency virus (HIV). Herpes, HIV, and HPV are viral illnesses that have no cure. They can result in disability, cancer, and death. Sexually active women aged 25 and younger should be checked for chlamydia. Older women with new or multiple partners should also be tested for chlamydia. Testing for other STIs is recommended if you are sexually active and at increased risk.  Osteoporosis is a disease in which the bones lose minerals and strength with aging. This can result in serious bone fractures. The risk of osteoporosis can be identified using a bone density scan. Women ages 65 and over and women at risk for fractures or osteoporosis should discuss screening with their caregivers. Ask your caregiver whether you should take a calcium supplement or vitamin D to reduce the rate of osteoporosis.  Menopause can be associated with physical symptoms and risks. Hormone replacement therapy is available to decrease symptoms and risks. You should talk to your caregiver about whether hormone replacement therapy is right for you.  Use sunscreen with sun protection factor (SPF) of 30 or more. Apply sunscreen liberally and repeatedly throughout the day. You should seek shade when your shadow is shorter than you. Protect yourself by wearing long sleeves, pants, a wide-brimmed hat, and sunglasses year round, whenever you are outdoors.  Once a month, do a whole body skin exam, using a mirror to look at the skin on your back. Notify your caregiver of new moles, moles that have irregular borders, moles that are larger than a pencil eraser, or moles that have changed in shape or color.  Stay current with required immunizations.  Influenza. You need a dose every fall (or winter). The composition of the flu vaccine  changes each year, so being vaccinated once is not enough.  Pneumococcal polysaccharide. You need 1 to 2 doses if you smoke cigarettes or if you have certain chronic medical conditions. You need 1 dose at age 65 (or older) if you have never been vaccinated.  Tetanus, diphtheria, pertussis (Tdap, Td). Get 1 dose of Tdap vaccine if you are younger than age 65, are over 65 and have contact with an infant, are a healthcare worker, are pregnant, or simply want to be protected from whooping cough. After that, you need a Td   booster dose every 10 years. Consult your caregiver if you have not had at least 3 tetanus and diphtheria-containing shots sometime in your life or have a deep or dirty wound.  HPV. You need this vaccine if you are a woman age 26 or younger. The vaccine is given in 3 doses over 6 months.  Measles, mumps, rubella (MMR). You need at least 1 dose of MMR if you were born in 1957 or later. You may also need a second dose.  Meningococcal. If you are age 19 to 21 and a first-year college student living in a residence hall, or have one of several medical conditions, you need to get vaccinated against meningococcal disease. You may also need additional booster doses.  Zoster (shingles). If you are age 60 or older, you should get this vaccine.  Varicella (chickenpox). If you have never had chickenpox or you were vaccinated but received only 1 dose, talk to your caregiver to find out if you need this vaccine.  Hepatitis A. You need this vaccine if you have a specific risk factor for hepatitis A virus infection or you simply wish to be protected from this disease. The vaccine is usually given as 2 doses, 6 to 18 months apart.  Hepatitis B. You need this vaccine if you have a specific risk factor for hepatitis B virus infection or you simply wish to be protected from this disease. The vaccine is given in 3 doses, usually over 6 months. Preventive Services / Frequency Ages 19 to 39  Blood  pressure check.** / Every 1 to 2 years.  Lipid and cholesterol check.** / Every 5 years beginning at age 20.  Clinical breast exam.** / Every 3 years for women in their 20s and 30s.  Pap test.** / Every 2 years from ages 21 through 29. Every 3 years starting at age 30 through age 65 or 70 with a history of 3 consecutive normal Pap tests.  HPV screening.** / Every 3 years from ages 30 through ages 65 to 70 with a history of 3 consecutive normal Pap tests.  Hepatitis C blood test.** / For any individual with known risks for hepatitis C.  Skin self-exam. / Monthly.  Influenza immunization.** / Every year.  Pneumococcal polysaccharide immunization.** / 1 to 2 doses if you smoke cigarettes or if you have certain chronic medical conditions.  Tetanus, diphtheria, pertussis (Tdap, Td) immunization. / A one-time dose of Tdap vaccine. After that, you need a Td booster dose every 10 years.  HPV immunization. / 3 doses over 6 months, if you are 26 and younger.  Measles, mumps, rubella (MMR) immunization. / You need at least 1 dose of MMR if you were born in 1957 or later. You may also need a second dose.  Meningococcal immunization. / 1 dose if you are age 19 to 21 and a first-year college student living in a residence hall, or have one of several medical conditions, you need to get vaccinated against meningococcal disease. You may also need additional booster doses.  Varicella immunization.** / Consult your caregiver.  Hepatitis A immunization.** / Consult your caregiver. 2 doses, 6 to 18 months apart.  Hepatitis B immunization.** / Consult your caregiver. 3 doses usually over 6 months. Ages 40 to 64  Blood pressure check.** / Every 1 to 2 years.  Lipid and cholesterol check.** / Every 5 years beginning at age 20.  Clinical breast exam.** / Every year after age 40.  Mammogram.** / Every year beginning at age 40   and continuing for as long as you are in good health. Consult with your  caregiver.  Pap test.** / Every 3 years starting at age 30 through age 65 or 70 with a history of 3 consecutive normal Pap tests.  HPV screening.** / Every 3 years from ages 30 through ages 65 to 70 with a history of 3 consecutive normal Pap tests.  Fecal occult blood test (FOBT) of stool. / Every year beginning at age 50 and continuing until age 75. You may not need to do this test if you get a colonoscopy every 10 years.  Flexible sigmoidoscopy or colonoscopy.** / Every 5 years for a flexible sigmoidoscopy or every 10 years for a colonoscopy beginning at age 50 and continuing until age 75.  Hepatitis C blood test.** / For all people born from 1945 through 1965 and any individual with known risks for hepatitis C.  Skin self-exam. / Monthly.  Influenza immunization.** / Every year.  Pneumococcal polysaccharide immunization.** / 1 to 2 doses if you smoke cigarettes or if you have certain chronic medical conditions.  Tetanus, diphtheria, pertussis (Tdap, Td) immunization.** / A one-time dose of Tdap vaccine. After that, you need a Td booster dose every 10 years.  Measles, mumps, rubella (MMR) immunization. / You need at least 1 dose of MMR if you were born in 1957 or later. You may also need a second dose.  Varicella immunization.** / Consult your caregiver.  Meningococcal immunization.** / Consult your caregiver.  Hepatitis A immunization.** / Consult your caregiver. 2 doses, 6 to 18 months apart.  Hepatitis B immunization.** / Consult your caregiver. 3 doses, usually over 6 months. Ages 65 and over  Blood pressure check.** / Every 1 to 2 years.  Lipid and cholesterol check.** / Every 5 years beginning at age 20.  Clinical breast exam.** / Every year after age 40.  Mammogram.** / Every year beginning at age 40 and continuing for as long as you are in good health. Consult with your caregiver.  Pap test.** / Every 3 years starting at age 30 through age 65 or 70 with a 3  consecutive normal Pap tests. Testing can be stopped between 65 and 70 with 3 consecutive normal Pap tests and no abnormal Pap or HPV tests in the past 10 years.  HPV screening.** / Every 3 years from ages 30 through ages 65 or 70 with a history of 3 consecutive normal Pap tests. Testing can be stopped between 65 and 70 with 3 consecutive normal Pap tests and no abnormal Pap or HPV tests in the past 10 years.  Fecal occult blood test (FOBT) of stool. / Every year beginning at age 50 and continuing until age 75. You may not need to do this test if you get a colonoscopy every 10 years.  Flexible sigmoidoscopy or colonoscopy.** / Every 5 years for a flexible sigmoidoscopy or every 10 years for a colonoscopy beginning at age 50 and continuing until age 75.  Hepatitis C blood test.** / For all people born from 1945 through 1965 and any individual with known risks for hepatitis C.  Osteoporosis screening.** / A one-time screening for women ages 65 and over and women at risk for fractures or osteoporosis.  Skin self-exam. / Monthly.  Influenza immunization.** / Every year.  Pneumococcal polysaccharide immunization.** / 1 dose at age 65 (or older) if you have never been vaccinated.  Tetanus, diphtheria, pertussis (Tdap, Td) immunization. / A one-time dose of Tdap vaccine if you are over   65 and have contact with an infant, are a healthcare worker, or simply want to be protected from whooping cough. After that, you need a Td booster dose every 10 years.  Varicella immunization.** / Consult your caregiver.  Meningococcal immunization.** / Consult your caregiver.  Hepatitis A immunization.** / Consult your caregiver. 2 doses, 6 to 18 months apart.  Hepatitis B immunization.** / Check with your caregiver. 3 doses, usually over 6 months. ** Family history and personal history of risk and conditions may change your caregiver's recommendations. Document Released: 08/03/2001 Document Revised: 08/30/2011  Document Reviewed: 11/02/2010 [employer] Patient Information 2014 [employer].  

## 2012-12-05 NOTE — Progress Notes (Signed)
  Subjective:     Abigail Miller is a 62 y.o. female and is here for a comprehensive physical exam. The patient reports no problems.  History   Social History  . Marital Status: Single    Spouse Name: N/A    Number of Children: N/A  . Years of Education: N/A   Occupational History  .  [employer]   Social History Main Topics  . Smoking status: Never Smoker   . Smokeless tobacco: Not on file  . Alcohol Use: No  . Drug Use: No  . Sexually Active: Not Currently -- Female partner(s)   Other Topics Concern  . Not on file   Social History Narrative   Exercise--gym and walking   Health Maintenance  Topic Date Due  . Mammogram  08/20/2011  . Pap Smear  08/19/2012  . Influenza Vaccine  02/19/2013  . Colonoscopy  06/21/2018  . Tetanus/tdap  12/06/2022  . Zostavax  Completed    The following portions of the patient's history were reviewed and updated as appropriate: allergies, current medications, past family history, past medical history, past social history, past surgical history and problem list.  Review of Systems Review of Systems  Constitutional: Negative for activity change, appetite change and fatigue.  HENT: Negative for hearing loss, congestion, tinnitus and ear discharge.  dentist q46m Eyes: Negative for visual disturbance (see optho q2y -- vision corrected to 20/20 with glasses).  Respiratory: Negative for cough, chest tightness and shortness of breath.   Cardiovascular: Negative for chest pain, palpitations and leg swelling.  Gastrointestinal: Negative for abdominal pain, diarrhea, constipation and abdominal distention.  Genitourinary: Negative for urgency, frequency, decreased urine volume and difficulty urinating.  Musculoskeletal: Negative for back pain, arthralgias and gait problem.  Skin: Negative for color change, pallor and rash.  Neurological: Negative for dizziness, light-headedness, numbness and headaches.  Hematological: Negative for  adenopathy. Does not bruise/bleed easily.  Psychiatric/Behavioral: Negative for suicidal ideas, confusion, sleep disturbance, self-injury, dysphoric mood, decreased concentration and agitation.       Objective:    BP 118/72  Pulse 110  Temp(Src) 98.9 F (37.2 C) (Oral)  Ht 5\' 3"  (1.6 m)  Wt 126 lb 12.8 oz (57.516 kg)  BMI 22.47 kg/m2  SpO2 97% General appearance:  Head: Normocephalic, without obvious abnormality, atraumatic Eyes: conjunctivae/corneas clear. PERRL, EOM's intact. Fundi benign. Ears: normal TM's and external ear canals both ears Nose: Nares normal. Septum midline. Mucosa normal. No drainage or sinus tenderness. Throat: lips, mucosa, and tongue normal; teeth and gums normal Neck: no adenopathy, no carotid bruit, no JVD, supple, symmetrical, trachea midline and thyroid not enlarged, symmetric, no tenderness/mass/nodules Back: symmetric, no curvature. ROM normal. No CVA tenderness. Lungs: clear to auscultation bilaterally Breasts: gyn Heart: regular rate and rhythm, S1, S2 normal, no murmur, click, rub or gallop Abdomen: soft, non-tender; bowel sounds normal; no masses,  no organomegaly Pelvic: deferred-- no gyn Extremities: extremities normal, atraumatic, no cyanosis or edema Pulses: 2+ and symmetric Skin: Skin color, texture, turgor normal. No rashes or lesions Lymph nodes: Cervical, supraclavicular, and axillary nodes normal. Neurologic: Alert and oriented X 3, normal strength and tone. Normal symmetric reflexes. Normal coordination and gait Psych-- no depression, no anxiety      Assessment:    Healthy female exam.     Plan:    ghm utd Check labs See After Visit Summary for Counseling Recommendations

## 2012-12-08 ENCOUNTER — Other Ambulatory Visit (INDEPENDENT_AMBULATORY_CARE_PROVIDER_SITE_OTHER): Payer: BC Managed Care – PPO

## 2012-12-08 DIAGNOSIS — E785 Hyperlipidemia, unspecified: Secondary | ICD-10-CM

## 2012-12-08 DIAGNOSIS — Z0000 Encounter for general adult medical examination without abnormal findings: Secondary | ICD-10-CM

## 2012-12-08 LAB — BASIC METABOLIC PANEL
BUN: 21 mg/dL (ref 6–23)
Chloride: 106 mEq/L (ref 96–112)
Glucose, Bld: 103 mg/dL — ABNORMAL HIGH (ref 70–99)
Potassium: 3.8 mEq/L (ref 3.5–5.1)

## 2012-12-08 LAB — MICROALBUMIN / CREATININE URINE RATIO
Creatinine,U: 158.8 mg/dL
Microalb Creat Ratio: 0.3 mg/g (ref 0.0–30.0)
Microalb, Ur: 0.4 mg/dL (ref 0.0–1.9)

## 2012-12-08 LAB — LIPID PANEL
Cholesterol: 227 mg/dL — ABNORMAL HIGH (ref 0–200)
Total CHOL/HDL Ratio: 2

## 2012-12-08 LAB — HEPATIC FUNCTION PANEL
ALT: 15 U/L (ref 0–35)
AST: 22 U/L (ref 0–37)
Albumin: 4.1 g/dL (ref 3.5–5.2)

## 2012-12-08 LAB — LDL CHOLESTEROL, DIRECT: Direct LDL: 106.4 mg/dL

## 2012-12-09 LAB — CBC WITH DIFFERENTIAL/PLATELET
Eosinophils Relative: 2.7 % (ref 0.0–5.0)
HCT: 39.8 % (ref 36.0–46.0)
Hemoglobin: 13.5 g/dL (ref 12.0–15.0)
Lymphs Abs: 1.5 10*3/uL (ref 0.7–4.0)
MCV: 88.1 fl (ref 78.0–100.0)
Monocytes Absolute: 0.4 10*3/uL (ref 0.1–1.0)
Neutro Abs: 2 10*3/uL (ref 1.4–7.7)
Platelets: 196 10*3/uL (ref 150.0–400.0)
WBC: 4 10*3/uL — ABNORMAL LOW (ref 4.5–10.5)

## 2013-04-06 ENCOUNTER — Encounter: Payer: Self-pay | Admitting: Family Medicine

## 2013-04-06 ENCOUNTER — Ambulatory Visit (INDEPENDENT_AMBULATORY_CARE_PROVIDER_SITE_OTHER): Payer: BC Managed Care – PPO | Admitting: Family Medicine

## 2013-04-06 VITALS — BP 112/72 | HR 92 | Temp 98.8°F | Wt 131.0 lb

## 2013-04-06 DIAGNOSIS — J019 Acute sinusitis, unspecified: Secondary | ICD-10-CM

## 2013-04-06 MED ORDER — CEFUROXIME AXETIL 500 MG PO TABS: 500.0000 mg | ORAL_TABLET | Freq: Two times a day (BID) | ORAL | Status: AC

## 2013-04-06 MED ORDER — BECLOMETHASONE DIPROPIONATE 80 MCG/ACT NA AERS: 2.0000 | INHALATION_SPRAY | Freq: Every day | NASAL | Status: DC

## 2013-04-06 NOTE — Patient Instructions (Signed)

## 2013-04-06 NOTE — Progress Notes (Signed)
  Subjective:     Abigail Miller is a 63 y.o. female who presents for evaluation of sinus pain. Symptoms include: congestion, facial pain, nasal congestion and sinus pressure. Onset of symptoms was 7 days ago. Symptoms have been gradually worsening since that time. Past history is significant for no history of pneumonia or bronchitis. Patient is a non-smoker.  The following portions of the patient's history were reviewed and updated as appropriate: allergies, current medications, past family history, past medical history, past social history, past surgical history and problem list.  Review of Systems Pertinent items are noted in HPI.   Objective:    BP 112/72  Pulse 92  Temp(Src) 98.8 F (37.1 C) (Oral)  Wt 131 lb (59.421 kg)  BMI 23.21 kg/m2  SpO2 97% General appearance: alert, cooperative, appears stated age and no distress Ears: normal TM's and external ear canals both ears Nose: green discharge, mild congestion, turbinates red, swollen, no sinus tenderness Throat: abnormal findings: pnd Neck: no adenopathy, supple, symmetrical, trachea midline and thyroid not enlarged, symmetric, no tenderness/mass/nodules Lungs: clear to auscultation bilaterally Heart: S1, S2 normal Extremities: extremities normal, atraumatic, no cyanosis or edema    Assessment:    Acute bacterial sinusitis.    Plan:    Nasal steroids per medication orders. Antihistamines per medication orders. Ceftin per medication orders.

## 2013-12-06 ENCOUNTER — Telehealth: Payer: Self-pay

## 2013-12-06 NOTE — Telephone Encounter (Signed)
Medication and allergies:  Reviewed and updated  90 day supply/mail order: n/a Local pharmacy:  [facility] [id number] - [place], [place] - [address] [facility]   Immunizations due:  UTD   A/P: No changes to personal, family history or past surgical hx PAP- pt reported---08/19/09- normal-[place]  [facility]- pt reported---06/21/08-normal-repeat in 10 years-[facility]- pt reported---31/11-normal BD- 2011- per patient Tdap- 12/05/12 Shingles- 12/05/12  To Discuss with Provider: Nothing at this time.

## 2013-12-07 ENCOUNTER — Encounter: Payer: Self-pay | Admitting: Family Medicine

## 2013-12-07 ENCOUNTER — Ambulatory Visit (INDEPENDENT_AMBULATORY_CARE_PROVIDER_SITE_OTHER): Payer: BC Managed Care – PPO | Admitting: Family Medicine

## 2013-12-07 VITALS — BP 126/76 | HR 91 | Temp 98.6°F | Ht 63.0 in | Wt 128.2 lb

## 2013-12-07 DIAGNOSIS — R809 Proteinuria, unspecified: Secondary | ICD-10-CM

## 2013-12-07 DIAGNOSIS — Z01419 Encounter for gynecological examination (general) (routine) without abnormal findings: Secondary | ICD-10-CM

## 2013-12-07 DIAGNOSIS — Z0000 Encounter for general adult medical examination without abnormal findings: Secondary | ICD-10-CM

## 2013-12-07 LAB — HEPATIC FUNCTION PANEL
ALK PHOS: 86 U/L (ref 39–117)
ALT: 15 U/L (ref 0–35)
AST: 26 U/L (ref 0–37)
Albumin: 4.3 g/dL (ref 3.5–5.2)
BILIRUBIN DIRECT: 0 mg/dL (ref 0.0–0.3)
TOTAL PROTEIN: 7.2 g/dL (ref 6.0–8.3)
Total Bilirubin: 0.5 mg/dL (ref 0.2–1.2)

## 2013-12-07 LAB — CBC WITH DIFFERENTIAL/PLATELET
BASOS PCT: 0.6 % (ref 0.0–3.0)
Basophils Absolute: 0 10*3/uL (ref 0.0–0.1)
EOS PCT: 1.3 % (ref 0.0–5.0)
Eosinophils Absolute: 0 10*3/uL (ref 0.0–0.7)
HCT: 41.6 % (ref 36.0–46.0)
Hemoglobin: 13.9 g/dL (ref 12.0–15.0)
LYMPHS PCT: 35.1 % (ref 12.0–46.0)
Lymphs Abs: 1.3 10*3/uL (ref 0.7–4.0)
MCHC: 33.4 g/dL (ref 30.0–36.0)
MCV: 88.2 fl (ref 78.0–100.0)
Monocytes Absolute: 0.3 10*3/uL (ref 0.1–1.0)
Monocytes Relative: 8.8 % (ref 3.0–12.0)
NEUTROS PCT: 54.2 % (ref 43.0–77.0)
Neutro Abs: 2 10*3/uL (ref 1.4–7.7)
Platelets: 180 10*3/uL (ref 150.0–400.0)
RBC: 4.71 Mil/uL (ref 3.87–5.11)
RDW: 13.1 % (ref 11.5–15.5)
WBC: 3.8 10*3/uL — AB (ref 4.0–10.5)

## 2013-12-07 LAB — LIPID PANEL
CHOL/HDL RATIO: 2
Cholesterol: 212 mg/dL — ABNORMAL HIGH (ref 0–200)
HDL: 96.2 mg/dL (ref >39.00)
LDL Cholesterol: 104 mg/dL — ABNORMAL HIGH (ref 0–99)
NONHDL: 115.8
Triglycerides: 57 mg/dL (ref 0.0–149.0)
VLDL: 11.4 mg/dL (ref 0.0–40.0)

## 2013-12-07 LAB — POCT URINALYSIS DIPSTICK
Bilirubin, UA: NEGATIVE
Blood, UA: NEGATIVE
GLUCOSE UA: NEGATIVE
Ketones, UA: NEGATIVE
Leukocytes, UA: NEGATIVE
NITRITE UA: NEGATIVE
Spec Grav, UA: 1.015
UROBILINOGEN UA: 0.2
pH, UA: 6

## 2013-12-07 LAB — BASIC METABOLIC PANEL
BUN: 17 mg/dL (ref 6–23)
CALCIUM: 10 mg/dL (ref 8.4–10.5)
CHLORIDE: 106 meq/L (ref 96–112)
CO2: 28 mEq/L (ref 19–32)
CREATININE: 0.8 mg/dL (ref 0.4–1.2)
GFR: 82.16 mL/min (ref >60.00)
Glucose, Bld: 84 mg/dL (ref 70–99)
Potassium: 3.5 mEq/L (ref 3.5–5.1)
Sodium: 140 mEq/L (ref 135–145)

## 2013-12-07 LAB — TSH: TSH: 0.89 u[IU]/mL (ref 0.35–4.50)

## 2013-12-07 NOTE — Progress Notes (Signed)
Subjective:     Abigail Miller is a 63 y.o. female and is here for a comprehensive physical exam. The patient reports no problems.  History   Social History  . Marital Status: Single    Spouse Name: N/A    Number of Children: N/A  . Years of Education: N/A   Occupational History  .  [employer]   Social History Main Topics  . Smoking status: Never Smoker   . Smokeless tobacco: Not on file  . Alcohol Use: No  . Drug Use: No  . Sexual Activity: No   Other Topics Concern  . Not on file   Social History Narrative   Exercise--gym and walking   Health Maintenance  Topic Date Due  . Mammogram  08/20/2011  . Pap Smear  08/19/2012  . Influenza Vaccine  01/19/2014  . Colonoscopy  06/21/2018  . Tetanus/tdap  12/06/2022  . Zostavax  Completed    The following portions of the patient's history were reviewed and updated as appropriate:  She  has a past medical history of No pertinent past medical history and Vein, varicose. She  does not have a problem list on file. She  has past surgical history that includes Colonoscopy; Tonsillectomy (2000); Cesarean section; Carpal tunnel release (12/03/2011); Trigger finger release (12/03/2011); and Dorsal compartment release (12/03/2011). Her family history includes Arthritis in her father and mother; Cancer (age of onset: 3986) in her father; Hyperlipidemia in her father and mother; Hypertension in her mother. She  reports that she has never smoked. She does not have any smokeless tobacco history on file. She reports that she does not drink alcohol or use illicit drugs. She currently has no medications in their medication list. No current outpatient prescriptions on file prior to visit.   No current facility-administered medications on file prior to visit.   She is allergic to sulfa antibiotics..  Review of Systems Review of Systems  Constitutional: Negative for activity change, appetite change and fatigue.  HENT: Negative for  hearing loss, congestion, tinnitus and ear discharge.  dentist q9066m Eyes: Negative for visual disturbance (see optho q1y -- vision corrected to 20/20 with glasses).  Respiratory: Negative for cough, chest tightness and shortness of breath.   Cardiovascular: Negative for chest pain, palpitations and leg swelling.  Gastrointestinal: Negative for abdominal pain, diarrhea, constipation and abdominal distention.  Genitourinary: Negative for urgency, frequency, decreased urine volume and difficulty urinating.  Musculoskeletal: Negative for back pain, arthralgias and gait problem.  Skin: Negative for color change, pallor and rash.  Neurological: Negative for dizziness, light-headedness, numbness and headaches.  Hematological: Negative for adenopathy. Does not bruise/bleed easily.  Psychiatric/Behavioral: Negative for suicidal ideas, confusion, sleep disturbance, self-injury, dysphoric mood, decreased concentration and agitation.       Objective:    BP 126/76  Pulse 91  Temp(Src) 98.6 F (37 C) (Oral)  Ht 5\' 3"  (1.6 m)  Wt 128 lb 3.2 oz (58.151 kg)  BMI 22.72 kg/m2  SpO2 98% General appearance: alert, cooperative, appears stated age and no distress Head: Normocephalic, without obvious abnormality, atraumatic Eyes: conjunctivae/corneas clear. PERRL, EOM's intact. Fundi benign. Ears: normal TM's and external ear canals both ears Nose: Nares normal. Septum midline. Mucosa normal. No drainage or sinus tenderness. Throat: lips, mucosa, and tongue normal; teeth and gums normal Neck: no adenopathy, no carotid bruit, no JVD, supple, symmetrical, trachea midline and thyroid not enlarged, symmetric, no tenderness/mass/nodules Back: symmetric, no curvature. ROM normal. No CVA tenderness. Lungs: clear to auscultation bilaterally  Breasts: gyn Heart: regular rate and rhythm, S1, S2 normal, no murmur, click, rub or gallop Abdomen: soft, non-tender; bowel sounds normal; no masses,  no  organomegaly Pelvic: deferred --gyn Extremities: extremities normal, atraumatic, no cyanosis or edema Pulses: 2+ and symmetric Skin: Skin color, texture, turgor normal. No rashes or lesions Lymph nodes: Cervical, supraclavicular, and axillary nodes normal. Neurologic: Alert and oriented X 3, normal strength and tone. Normal symmetric reflexes. Normal coordination and gait    Assessment:    Healthy female exam.      Plan:    ghm utd  Check labs See After Visit Summary for Counseling Recommendations   1. Encounter for routine gynecological examination  Pt requesting a referral to gyn - Ambulatory referral to Gynecology  2. Preventative health care   - Basic metabolic panel - CBC with Differential - Hepatic function panel - Lipid panel - POCT urinalysis dipstick - TSH - EKG 12-Lead

## 2013-12-07 NOTE — Patient Instructions (Signed)
Preventive Care for Adults A healthy lifestyle and preventive care can promote health and wellness. Preventive health guidelines for women include the following key practices.  A routine yearly physical is a good way to check with your health care provider about your health and preventive screening. It is a chance to share any concerns and updates on your health and to receive a thorough exam.  Visit your dentist for a routine exam and preventive care every 6 months. Brush your teeth twice a day and floss once a day. Good oral hygiene prevents tooth decay and gum disease.  The frequency of eye exams is based on your age, health, family medical history, use of contact lenses, and other factors. Follow your health care provider's recommendations for frequency of eye exams.  Eat a healthy diet. Foods like vegetables, fruits, whole grains, low-fat dairy products, and lean protein foods contain the nutrients you need without too many calories. Decrease your intake of foods high in solid fats, added sugars, and salt. Eat the right amount of calories for you.Get information about a proper diet from your health care provider, if necessary.  Regular physical exercise is one of the most important things you can do for your health. Most adults should get at least 150 minutes of moderate-intensity exercise (any activity that increases your heart rate and causes you to sweat) each week. In addition, most adults need muscle-strengthening exercises on 2 or more days a week.  Maintain a healthy weight. The body mass index (BMI) is a screening tool to identify possible weight problems. It provides an estimate of body fat based on height and weight. Your health care provider can find your BMI, and can help you achieve or maintain a healthy weight.For adults 20 years and older:  A BMI below 18.5 is considered underweight.  A BMI of 18.5 to 24.9 is normal.  A BMI of 25 to 29.9 is considered overweight.  A BMI of  30 and above is considered obese.  Maintain normal blood lipids and cholesterol levels by exercising and minimizing your intake of saturated fat. Eat a balanced diet with plenty of fruit and vegetables. Blood tests for lipids and cholesterol should begin at age 52 and be repeated every 5 years. If your lipid or cholesterol levels are high, you are over 50, or you are at high risk for heart disease, you may need your cholesterol levels checked more frequently.Ongoing high lipid and cholesterol levels should be treated with medicines if diet and exercise are not working.  If you smoke, find out from your health care provider how to quit. If you do not use tobacco, do not start.  Lung cancer screening is recommended for adults aged 37-80 years who are at high risk for developing lung cancer because of a history of smoking. A yearly low-dose CT scan of the lungs is recommended for people who have at least a 30-pack-year history of smoking and are a current smoker or have quit within the past 15 years. A pack year of smoking is smoking an average of 1 pack of cigarettes a day for 1 year (for example: 1 pack a day for 30 years or 2 packs a day for 15 years). Yearly screening should continue until the smoker has stopped smoking for at least 15 years. Yearly screening should be stopped for people who develop a health problem that would prevent them from having lung cancer treatment.  If you are pregnant, do not drink alcohol. If you are breastfeeding,  be very cautious about drinking alcohol. If you are not pregnant and choose to drink alcohol, do not have more than 1 drink per day. One drink is considered to be 12 ounces (355 mL) of beer, 5 ounces (148 mL) of wine, or 1.5 ounces (44 mL) of liquor.  Avoid use of street drugs. Do not share needles with anyone. Ask for help if you need support or instructions about stopping the use of drugs.  High blood pressure causes heart disease and increases the risk of  stroke. Your blood pressure should be checked at least every 1 to 2 years. Ongoing high blood pressure should be treated with medicines if weight loss and exercise do not work.  If you are 75-52 years old, ask your health care provider if you should take aspirin to prevent strokes.  Diabetes screening involves taking a blood sample to check your fasting blood sugar level. This should be done once every 3 years, after age 15, if you are within normal weight and without risk factors for diabetes. Testing should be considered at a younger age or be carried out more frequently if you are overweight and have at least 1 risk factor for diabetes.  Breast cancer screening is essential preventive care for women. You should practice "breast self-awareness." This means understanding the normal appearance and feel of your breasts and may include breast self-examination. Any changes detected, no matter how small, should be reported to a health care provider. Women in their 58s and 30s should have a clinical breast exam (CBE) by a health care provider as part of a regular health exam every 1 to 3 years. After age 16, women should have a CBE every year. Starting at age 53, women should consider having a mammogram (breast X-ray test) every year. Women who have a family history of breast cancer should talk to their health care provider about genetic screening. Women at a high risk of breast cancer should talk to their health care providers about having an MRI and a mammogram every year.  Breast cancer gene (BRCA)-related cancer risk assessment is recommended for women who have family members with BRCA-related cancers. BRCA-related cancers include breast, ovarian, tubal, and peritoneal cancers. Having family members with these cancers may be associated with an increased risk for harmful changes (mutations) in the breast cancer genes BRCA1 and BRCA2. Results of the assessment will determine the need for genetic counseling and  BRCA1 and BRCA2 testing.  Routine pelvic exams to screen for cancer are no longer recommended for nonpregnant women who are considered low risk for cancer of the pelvic organs (ovaries, uterus, and vagina) and who do not have symptoms. Ask your health care provider if a screening pelvic exam is right for you.  If you have had past treatment for cervical cancer or a condition that could lead to cancer, you need Pap tests and screening for cancer for at least 20 years after your treatment. If Pap tests have been discontinued, your risk factors (such as having a new sexual partner) need to be reassessed to determine if screening should be resumed. Some women have medical problems that increase the chance of getting cervical cancer. In these cases, your health care provider may recommend more frequent screening and Pap tests.  The HPV test is an additional test that may be used for cervical cancer screening. The HPV test looks for the virus that can cause the cell changes on the cervix. The cells collected during the Pap test can be  tested for HPV. The HPV test could be used to screen women aged 47 years and older, and should be used in women of any age who have unclear Pap test results. After the age of 36, women should have HPV testing at the same frequency as a Pap test.  Colorectal cancer can be detected and often prevented. Most routine colorectal cancer screening begins at the age of 38 years and continues through age 58 years. However, your health care provider may recommend screening at an earlier age if you have risk factors for colon cancer. On a yearly basis, your health care provider may provide home test kits to check for hidden blood in the stool. Use of a small camera at the end of a tube, to directly examine the colon (sigmoidoscopy or colonoscopy), can detect the earliest forms of colorectal cancer. Talk to your health care provider about this at age 64, when routine screening begins. Direct  exam of the colon should be repeated every 5-10 years through age 21 years, unless early forms of pre-cancerous polyps or small growths are found.  People who are at an increased risk for hepatitis B should be screened for this virus. You are considered at high risk for hepatitis B if:  You were born in a country where hepatitis B occurs often. Talk with your health care provider about which countries are considered high risk.  Your parents were born in a high-risk country and you have not received a shot to protect against hepatitis B (hepatitis B vaccine).  You have HIV or AIDS.  You use needles to inject street drugs.  You live with, or have sex with, someone who has Hepatitis B.  You get hemodialysis treatment.  You take certain medicines for conditions like cancer, organ transplantation, and autoimmune conditions.  Hepatitis C blood testing is recommended for all people born from [birth year] through 1965 and any individual with known risks for hepatitis C.  Practice safe sex. Use condoms and avoid high-risk sexual practices to reduce the spread of sexually transmitted infections (STIs). STIs include gonorrhea, chlamydia, syphilis, trichomonas, herpes, HPV, and human immunodeficiency virus (HIV). Herpes, HIV, and HPV are viral illnesses that have no cure. They can result in disability, cancer, and death.  You should be screened for sexually transmitted illnesses (STIs) including gonorrhea and chlamydia if:  You are sexually active and are younger than 24 years.  You are older than 24 years and your health care provider tells you that you are at risk for this type of infection.  Your sexual activity has changed since you were last screened and you are at an increased risk for chlamydia or gonorrhea. Ask your health care provider if you are at risk.  If you are at risk of being infected with HIV, it is recommended that you take a prescription medicine daily to prevent HIV infection. This is  called preexposure prophylaxis (PrEP). You are considered at risk if:  You are a heterosexual woman, are sexually active, and are at increased risk for HIV infection.  You take drugs by injection.  You are sexually active with a partner who has HIV.  Talk with your health care provider about whether you are at high risk of being infected with HIV. If you choose to begin PrEP, you should first be tested for HIV. You should then be tested every 3 months for as long as you are taking PrEP.  Osteoporosis is a disease in which the bones lose minerals and strength  with aging. This can result in serious bone fractures or breaks. The risk of osteoporosis can be identified using a bone density scan. Women ages 65 years and over and women at risk for fractures or osteoporosis should discuss screening with their health care providers. Ask your health care provider whether you should take a calcium supplement or vitamin D to reduce the rate of osteoporosis.  Menopause can be associated with physical symptoms and risks. Hormone replacement therapy is available to decrease symptoms and risks. You should talk to your health care provider about whether hormone replacement therapy is right for you.  Use sunscreen. Apply sunscreen liberally and repeatedly throughout the day. You should seek shade when your shadow is shorter than you. Protect yourself by wearing long sleeves, pants, a wide-brimmed hat, and sunglasses year round, whenever you are outdoors.  Once a month, do a whole body skin exam, using a mirror to look at the skin on your back. Tell your health care provider of new moles, moles that have irregular borders, moles that are larger than a pencil eraser, or moles that have changed in shape or color.  Stay current with required vaccines (immunizations).  Influenza vaccine. All adults should be immunized every year.  Tetanus, diphtheria, and acellular pertussis (Td, Tdap) vaccine. Pregnant women should  receive 1 dose of Tdap vaccine during each pregnancy. The dose should be obtained regardless of the length of time since the last dose. Immunization is preferred during the 27th-36th week of gestation. An adult who has not previously received Tdap or who does not know her vaccine status should receive 1 dose of Tdap. This initial dose should be followed by tetanus and diphtheria toxoids (Td) booster doses every 10 years. Adults with an unknown or incomplete history of completing a 3-dose immunization series with Td-containing vaccines should begin or complete a primary immunization series including a Tdap dose. Adults should receive a Td booster every 10 years.  Varicella vaccine. An adult without evidence of immunity to varicella should receive 2 doses or a second dose if she has previously received 1 dose. Pregnant females who do not have evidence of immunity should receive the first dose after pregnancy. This first dose should be obtained before leaving the health care facility. The second dose should be obtained 4-8 weeks after the first dose.  Human papillomavirus (HPV) vaccine. Females aged 13-26 years who have not received the vaccine previously should obtain the 3-dose series. The vaccine is not recommended for use in pregnant females. However, pregnancy testing is not needed before receiving a dose. If a female is found to be pregnant after receiving a dose, no treatment is needed. In that case, the remaining doses should be delayed until after the pregnancy. Immunization is recommended for any person with an immunocompromised condition through the age of 26 years if she did not get any or all doses earlier. During the 3-dose series, the second dose should be obtained 4-8 weeks after the first dose. The third dose should be obtained 24 weeks after the first dose and 16 weeks after the second dose.  Zoster vaccine. One dose is recommended for adults aged 60 years or older unless certain conditions are  present.  Measles, mumps, and rubella (MMR) vaccine. Adults born before 1957 generally are considered immune to measles and mumps. Adults born in 1957 or later should have 1 or more doses of MMR vaccine unless there is a contraindication to the vaccine or there is laboratory evidence of immunity to   each of the three diseases. A routine second dose of MMR vaccine should be obtained at least 28 days after the first dose for students attending postsecondary schools, health care workers, or international travelers. People who received inactivated measles vaccine or an unknown type of measles vaccine during 1963-1967 should receive 2 doses of MMR vaccine. People who received inactivated mumps vaccine or an unknown type of mumps vaccine before 1979 and are at high risk for mumps infection should consider immunization with 2 doses of MMR vaccine. For females of childbearing age, rubella immunity should be determined. If there is no evidence of immunity, females who are not pregnant should be vaccinated. If there is no evidence of immunity, females who are pregnant should delay immunization until after pregnancy. Unvaccinated health care workers born before 1957 who lack laboratory evidence of measles, mumps, or rubella immunity or laboratory confirmation of disease should consider measles and mumps immunization with 2 doses of MMR vaccine or rubella immunization with 1 dose of MMR vaccine.  Pneumococcal 13-valent conjugate (PCV13) vaccine. When indicated, a person who is uncertain of her immunization history and has no record of immunization should receive the PCV13 vaccine. An adult aged 19 years or older who has certain medical conditions and has not been previously immunized should receive 1 dose of PCV13 vaccine. This PCV13 should be followed with a dose of pneumococcal polysaccharide (PPSV23) vaccine. The PPSV23 vaccine dose should be obtained at least 8 weeks after the dose of PCV13 vaccine. An adult aged 19  years or older who has certain medical conditions and previously received 1 or more doses of PPSV23 vaccine should receive 1 dose of PCV13. The PCV13 vaccine dose should be obtained 1 or more years after the last PPSV23 vaccine dose.  Pneumococcal polysaccharide (PPSV23) vaccine. When PCV13 is also indicated, PCV13 should be obtained first. All adults aged 65 years and older should be immunized. An adult younger than age 65 years who has certain medical conditions should be immunized. Any person who resides in a nursing home or long-term care facility should be immunized. An adult smoker should be immunized. People with an immunocompromised condition and certain other conditions should receive both PCV13 and PPSV23 vaccines. People with human immunodeficiency virus (HIV) infection should be immunized as soon as possible after diagnosis. Immunization during chemotherapy or radiation therapy should be avoided. Routine use of PPSV23 vaccine is not recommended for American Indians, [place] Natives, or people younger than 65 years unless there are medical conditions that require PPSV23 vaccine. When indicated, people who have unknown immunization and have no record of immunization should receive PPSV23 vaccine. One-time revaccination 5 years after the first dose of PPSV23 is recommended for people aged 19-64 years who have chronic kidney failure, nephrotic syndrome, asplenia, or immunocompromised conditions. People who received 1-2 doses of PPSV23 before age 65 years should receive another dose of PPSV23 vaccine at age 65 years or later if at least 5 years have passed since the previous dose. Doses of PPSV23 are not needed for people immunized with PPSV23 at or after age 65 years.  Meningococcal vaccine. Adults with asplenia or persistent complement component deficiencies should receive 2 doses of quadrivalent meningococcal conjugate (MenACWY-D) vaccine. The doses should be obtained at least 2 months apart.  Microbiologists working with certain meningococcal bacteria, military recruits, people at risk during an outbreak, and people who travel to or live in countries with a high rate of meningitis should be immunized. A first-year college student up through age   21 years who is living in a residence hall should receive a dose if she did not receive a dose on or after her 16th birthday. Adults who have certain high-risk conditions should receive one or more doses of vaccine.  Hepatitis A vaccine. Adults who wish to be protected from this disease, have certain high-risk conditions, work with hepatitis A-infected animals, work in hepatitis A research labs, or travel to or work in countries with a high rate of hepatitis A should be immunized. Adults who were previously unvaccinated and who anticipate close contact with an international adoptee during the first 60 days after arrival in the [place] from a country with a high rate of hepatitis A should be immunized.  Hepatitis B vaccine. Adults who wish to be protected from this disease, have certain high-risk conditions, may be exposed to blood or other infectious body fluids, are household contacts or sex partners of hepatitis B positive people, are clients or workers in certain care facilities, or travel to or work in countries with a high rate of hepatitis B should be immunized.  Haemophilus influenzae type b (Hib) vaccine. A previously unvaccinated person with asplenia or sickle cell disease or having a scheduled splenectomy should receive 1 dose of Hib vaccine. Regardless of previous immunization, a recipient of a hematopoietic stem cell transplant should receive a 3-dose series 6-12 months after her successful transplant. Hib vaccine is not recommended for adults with HIV infection. Preventive Services / Frequency Ages 43 to 39years  Blood pressure check.** / Every 1 to 2 years.  Lipid and cholesterol check.** / Every 5 years beginning at age  75.  Clinical breast exam.** / Every 3 years for women in their 32s and 74s.  BRCA-related cancer risk assessment.** / For women who have family members with a BRCA-related cancer (breast, ovarian, tubal, or peritoneal cancers).  Pap test.** / Every 2 years from ages 65 through 91. Every 3 years starting at age 34 through age 93 or 72 with a history of 3 consecutive normal Pap tests.  HPV screening.** / Every 3 years from ages 46 through ages 53 to 26 with a history of 3 consecutive normal Pap tests.  Hepatitis C blood test.** / For any individual with known risks for hepatitis C.  Skin self-exam. / Monthly.  Influenza vaccine. / Every year.  Tetanus, diphtheria, and acellular pertussis (Tdap, Td) vaccine.** / Consult your health care provider. Pregnant women should receive 1 dose of Tdap vaccine during each pregnancy. 1 dose of Td every 10 years.  Varicella vaccine.** / Consult your health care provider. Pregnant females who do not have evidence of immunity should receive the first dose after pregnancy.  HPV vaccine. / 3 doses over 6 months, if 70 and younger. The vaccine is not recommended for use in pregnant females. However, pregnancy testing is not needed before receiving a dose.  Measles, mumps, rubella (MMR) vaccine.** / You need at least 1 dose of MMR if you were born in 1957 or later. You may also need a 2nd dose. For females of childbearing age, rubella immunity should be determined. If there is no evidence of immunity, females who are not pregnant should be vaccinated. If there is no evidence of immunity, females who are pregnant should delay immunization until after pregnancy.  Pneumococcal 13-valent conjugate (PCV13) vaccine.** / Consult your health care provider.  Pneumococcal polysaccharide (PPSV23) vaccine.** / 1 to 2 doses if you smoke cigarettes or if you have certain conditions.  Meningococcal vaccine.** /  1 dose if you are age 70 to 51 years and a [place] living in a residence hall, or have one of several medical conditions, you need to get vaccinated against meningococcal disease. You may also need additional booster doses.  Hepatitis A vaccine.** / Consult your health care provider.  Hepatitis B vaccine.** / Consult your health care provider.  Haemophilus influenzae type b (Hib) vaccine.** / Consult your health care provider. Ages 40 to 64years  Blood pressure check.** / Every 1 to 2 years.  Lipid and cholesterol check.** / Every 5 years beginning at age 58 years.  Lung cancer screening. / Every year if you are aged 56-80 years and have a 30-pack-year history of smoking and currently smoke or have quit within the past 15 years. Yearly screening is stopped once you have quit smoking for at least 15 years or develop a health problem that would prevent you from having lung cancer treatment.  Clinical breast exam.** / Every year after age 35 years.  BRCA-related cancer risk assessment.** / For women who have family members with a BRCA-related cancer (breast, ovarian, tubal, or peritoneal cancers).  Mammogram.** / Every year beginning at age 109 years and continuing for as long as you are in good health. Consult with your health care provider.  Pap test.** / Every 3 years starting at age 44 years through age 94 or 70 years with a history of 3 consecutive normal Pap tests.  HPV screening.** / Every 3 years from ages 109 years through ages 50 to 30 years with a history of 3 consecutive normal Pap tests.  Fecal occult blood test (FOBT) of stool. / Every year beginning at age 73 years and continuing until age 59 years. You may not need to do this test if you get a colonoscopy every 10 years.  Flexible sigmoidoscopy or colonoscopy.** / Every 5 years for a flexible sigmoidoscopy or every 10 years for a colonoscopy beginning at age 68 years and continuing until age 12 years.  Hepatitis C blood test.** / For all people born from [birth year] through  1965 and any individual with known risks for hepatitis C.  Skin self-exam. / Monthly.  Influenza vaccine. / Every year.  Tetanus, diphtheria, and acellular pertussis (Tdap/Td) vaccine.** / Consult your health care provider. Pregnant women should receive 1 dose of Tdap vaccine during each pregnancy. 1 dose of Td every 10 years.  Varicella vaccine.** / Consult your health care provider. Pregnant females who do not have evidence of immunity should receive the first dose after pregnancy.  Zoster vaccine.** / 1 dose for adults aged 2 years or older.  Measles, mumps, rubella (MMR) vaccine.** / You need at least 1 dose of MMR if you were born in 1957 or later. You may also need a 2nd dose. For females of childbearing age, rubella immunity should be determined. If there is no evidence of immunity, females who are not pregnant should be vaccinated. If there is no evidence of immunity, females who are pregnant should delay immunization until after pregnancy.  Pneumococcal 13-valent conjugate (PCV13) vaccine.** / Consult your health care provider.  Pneumococcal polysaccharide (PPSV23) vaccine.** / 1 to 2 doses if you smoke cigarettes or if you have certain conditions.  Meningococcal vaccine.** / Consult your health care provider.  Hepatitis A vaccine.** / Consult your health care provider.  Hepatitis B vaccine.** / Consult your health care provider.  Haemophilus influenzae type b (Hib) vaccine.** / Consult your health care provider. Ages 48 years  and over  Blood pressure check.** / Every 1 to 2 years.  Lipid and cholesterol check.** / Every 5 years beginning at age 84 years.  Lung cancer screening. / Every year if you are aged 50-80 years and have a 30-pack-year history of smoking and currently smoke or have quit within the past 15 years. Yearly screening is stopped once you have quit smoking for at least 15 years or develop a health problem that would prevent you from having lung cancer  treatment.  Clinical breast exam.** / Every year after age 24 years.  BRCA-related cancer risk assessment.** / For women who have family members with a BRCA-related cancer (breast, ovarian, tubal, or peritoneal cancers).  Mammogram.** / Every year beginning at age 14 years and continuing for as long as you are in good health. Consult with your health care provider.  Pap test.** / Every 3 years starting at age 17 years through age 31 or 74 years with 3 consecutive normal Pap tests. Testing can be stopped between 65 and 70 years with 3 consecutive normal Pap tests and no abnormal Pap or HPV tests in the past 10 years.  HPV screening.** / Every 3 years from ages 30 years through ages 70 or 28 years with a history of 3 consecutive normal Pap tests. Testing can be stopped between 65 and 70 years with 3 consecutive normal Pap tests and no abnormal Pap or HPV tests in the past 10 years.  Fecal occult blood test (FOBT) of stool. / Every year beginning at age 64 years and continuing until age 92 years. You may not need to do this test if you get a colonoscopy every 10 years.  Flexible sigmoidoscopy or colonoscopy.** / Every 5 years for a flexible sigmoidoscopy or every 10 years for a colonoscopy beginning at age 73 years and continuing until age 39 years.  Hepatitis C blood test.** / For all people born from [birth year] through 1963 and any individual with known risks for hepatitis C.  Osteoporosis screening.** / A one-time screening for women ages 35 years and over and women at risk for fractures or osteoporosis.  Skin self-exam. / Monthly.  Influenza vaccine. / Every year.  Tetanus, diphtheria, and acellular pertussis (Tdap/Td) vaccine.** / 1 dose of Td every 10 years.  Varicella vaccine.** / Consult your health care provider.  Zoster vaccine.** / 1 dose for adults aged 59 years or older.  Pneumococcal 13-valent conjugate (PCV13) vaccine.** / Consult your health care provider.  Pneumococcal  polysaccharide (PPSV23) vaccine.** / 1 dose for all adults aged 8 years and older.  Meningococcal vaccine.** / Consult your health care provider.  Hepatitis A vaccine.** / Consult your health care provider.  Hepatitis B vaccine.** / Consult your health care provider.  Haemophilus influenzae type b (Hib) vaccine.** / Consult your health care provider. ** Family history and personal history of risk and conditions may change your health care provider's recommendations. Document Released: 08/03/2001 Document Revised: 06/12/2013 Document Reviewed: 11/02/2010 [facility] Patient Information 2015 [place], [place]. This information is not intended to replace advice given to you by your health care provider. Make sure you discuss any questions you have with your health care provider.

## 2013-12-07 NOTE — Addendum Note (Signed)
Addended by: Eustace QuailEABOLD, DAVID J on: 12/07/2013 04:47 PM   Modules accepted: Orders

## 2013-12-07 NOTE — Progress Notes (Signed)
Pre visit review using our clinic review tool, if applicable. No additional management support is needed unless otherwise documented below in the visit note. 

## 2013-12-09 LAB — URINE CULTURE
Colony Count: NO GROWTH
Organism ID, Bacteria: NO GROWTH

## 2014-06-24 ENCOUNTER — Telehealth: Payer: Self-pay | Admitting: Family Medicine

## 2014-06-24 NOTE — Telephone Encounter (Signed)
She needs to be seen to evaluate.      KP

## 2014-06-24 NOTE — Telephone Encounter (Signed)
Caller name: Bonetta Relation to pt: self Call back number: [phone] Pharmacy: [facility] [address] in [place]  Reason for call:   Patient was watching grandson over the weekend and he has pink eye, now she has pink eye and would like to know if something could be called in. Offered appt, pt declined

## 2014-06-24 NOTE — Telephone Encounter (Signed)
Informed patient of this and she states that she will see how her eye is doing tomorrow and will call if she feels like she needs an appointment

## 2014-09-03 ENCOUNTER — Encounter: Payer: Self-pay | Admitting: *Deleted

## 2014-09-04 ENCOUNTER — Ambulatory Visit (INDEPENDENT_AMBULATORY_CARE_PROVIDER_SITE_OTHER): Payer: BC Managed Care – PPO | Admitting: *Deleted

## 2014-09-04 DIAGNOSIS — I8393 Asymptomatic varicose veins of bilateral lower extremities: Secondary | ICD-10-CM

## 2014-09-04 NOTE — Progress Notes (Signed)
X=.5% Sotradecol administered with a 27g butterfly.  Patient received a total of 6cc.  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 Some smaller vessels did close. 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 Dr Darrick PennaFields consulted. Pt decide to try one syringe and see if it closes. If it doesn't close, she will return for a reflux study. She will update me in 3months.  Photos: Yes.    Compression stockings applied: Yes.

## 2014-09-05 ENCOUNTER — Encounter: Payer: Self-pay | Admitting: *Deleted

## 2014-10-09 ENCOUNTER — Ambulatory Visit: Payer: BC Managed Care – PPO | Admitting: *Deleted

## 2014-11-25 ENCOUNTER — Telehealth: Payer: Self-pay | Admitting: Family Medicine

## 2014-11-25 NOTE — Telephone Encounter (Signed)
Pre Visit letter sent  °

## 2014-12-12 ENCOUNTER — Telehealth: Payer: Self-pay | Admitting: Behavioral Health

## 2014-12-12 ENCOUNTER — Encounter: Payer: Self-pay | Admitting: Behavioral Health

## 2014-12-12 ENCOUNTER — Telehealth: Payer: Self-pay

## 2014-12-12 NOTE — Telephone Encounter (Signed)
Lm for patient to return the call  Minimal data in chart

## 2014-12-12 NOTE — Telephone Encounter (Signed)
Pre-Visit Call completed with patient and chart updated.   Pre-Visit Info documented in Specialty Comments under [employer].    

## 2014-12-12 NOTE — Telephone Encounter (Signed)
See additional phone note. 

## 2014-12-13 ENCOUNTER — Ambulatory Visit (INDEPENDENT_AMBULATORY_CARE_PROVIDER_SITE_OTHER): Payer: BC Managed Care – PPO | Admitting: Family Medicine

## 2014-12-13 ENCOUNTER — Encounter: Payer: Self-pay | Admitting: Family Medicine

## 2014-12-13 VITALS — BP 120/72 | HR 72 | Temp 98.4°F | Ht 63.0 in | Wt 125.8 lb

## 2014-12-13 DIAGNOSIS — Z0000 Encounter for general adult medical examination without abnormal findings: Secondary | ICD-10-CM

## 2014-12-13 DIAGNOSIS — E2839 Other primary ovarian failure: Secondary | ICD-10-CM

## 2014-12-13 DIAGNOSIS — Z1231 Encounter for screening mammogram for malignant neoplasm of breast: Secondary | ICD-10-CM

## 2014-12-13 LAB — HEPATIC FUNCTION PANEL
ALT: 16 U/L (ref 0–35)
AST: 23 U/L (ref 0–37)
Albumin: 4.2 g/dL (ref 3.5–5.2)
Alkaline Phosphatase: 89 U/L (ref 39–117)
Bilirubin, Direct: 0 mg/dL (ref 0.0–0.3)
TOTAL PROTEIN: 7 g/dL (ref 6.0–8.3)
Total Bilirubin: 0.6 mg/dL (ref 0.2–1.2)

## 2014-12-13 LAB — CBC WITH DIFFERENTIAL/PLATELET
BASOS PCT: 0.7 % (ref 0.0–3.0)
Basophils Absolute: 0 10*3/uL (ref 0.0–0.1)
EOS ABS: 0.1 10*3/uL (ref 0.0–0.7)
Eosinophils Relative: 1.9 % (ref 0.0–5.0)
HEMATOCRIT: 41.1 % (ref 36.0–46.0)
Hemoglobin: 13.8 g/dL (ref 12.0–15.0)
Lymphocytes Relative: 33.5 % (ref 12.0–46.0)
Lymphs Abs: 1.3 10*3/uL (ref 0.7–4.0)
MCHC: 33.5 g/dL (ref 30.0–36.0)
MCV: 87.7 fl (ref 78.0–100.0)
Monocytes Absolute: 0.3 10*3/uL (ref 0.1–1.0)
Monocytes Relative: 8.6 % (ref 3.0–12.0)
NEUTROS ABS: 2.2 10*3/uL (ref 1.4–7.7)
Neutrophils Relative %: 55.3 % (ref 43.0–77.0)
Platelets: 200 10*3/uL (ref 150.0–400.0)
RBC: 4.69 Mil/uL (ref 3.87–5.11)
RDW: 13.6 % (ref 11.5–15.5)
WBC: 4 10*3/uL (ref 4.0–10.5)

## 2014-12-13 LAB — BASIC METABOLIC PANEL
BUN: 18 mg/dL (ref 6–23)
CO2: 31 meq/L (ref 19–32)
CREATININE: 0.77 mg/dL (ref 0.40–1.20)
Calcium: 10.2 mg/dL (ref 8.4–10.5)
Chloride: 107 mEq/L (ref 96–112)
GFR: 80.66 mL/min (ref >60.00)
Glucose, Bld: 85 mg/dL (ref 70–99)
Potassium: 3.8 mEq/L (ref 3.5–5.1)
Sodium: 141 mEq/L (ref 135–145)

## 2014-12-13 LAB — LIPID PANEL
Cholesterol: 200 mg/dL (ref 0–200)
HDL: 92 mg/dL (ref >39.00)
LDL CALC: 96 mg/dL (ref 0–99)
NonHDL: 108
TRIGLYCERIDES: 59 mg/dL (ref 0.0–149.0)
Total CHOL/HDL Ratio: 2
VLDL: 11.8 mg/dL (ref 0.0–40.0)

## 2014-12-13 LAB — TSH: TSH: 0.82 u[IU]/mL (ref 0.35–4.50)

## 2014-12-13 NOTE — Progress Notes (Signed)
Subjective:     Abigail Miller is a 64 y.o. female and is here for a comprehensive physical exam. The patient reports no problems.  History   Social History  . Marital Status: Single    Spouse Name: N/A  . Number of Children: N/A  . Years of Education: N/A   Occupational History  .  [employer]   Social History Main Topics  . Smoking status: Never Smoker   . Smokeless tobacco: Not on file  . Alcohol Use: No  . Drug Use: No  . Sexual Activity: No   Other Topics Concern  . Not on file   Social History Narrative   Exercise--gym and walking   Health Maintenance  Topic Date Due  . MAMMOGRAM  03/15/2015 (Originally 08/20/2011)  . PAP SMEAR  12/13/2015 (Originally 08/19/2012)  . HIV Screening  12/13/2015 (Originally 09/01/1965)  . INFLUENZA VACCINE  01/20/2015  . COLONOSCOPY  06/21/2018  . TETANUS/TDAP  12/06/2022  . ZOSTAVAX  Completed    The following portions of the patient's history were reviewed and updated as appropriate:  She  has a past medical history of No pertinent past medical history and Vein, varicose. She  does not have a problem list on file. She  has past surgical history that includes Colonoscopy; Tonsillectomy (2000); Cesarean section; Carpal tunnel release (12/03/2011); Trigger finger release (12/03/2011); and Dorsal compartment release (12/03/2011). Her family history includes Arthritis in her father and mother; Cancer (age of onset: 71) in her father; Hyperlipidemia in her father and mother; Hypertension in her mother. She  reports that she has never smoked. She does not have any smokeless tobacco history on file. She reports that she does not drink alcohol or use illicit drugs. She currently has no medications in their medication list. No current outpatient prescriptions on file prior to visit.   No current facility-administered medications on file prior to visit.   She is allergic to sulfa  antibiotics..  Review of Systems Review of Systems  Constitutional: Negative for activity change, appetite change and fatigue.  HENT: Negative for hearing loss, congestion, tinnitus and ear discharge.  dentist q39m Eyes: Negative for visual disturbance (see optho q1y -- vision corrected to 20/20 with glasses).  Respiratory: Negative for cough, chest tightness and shortness of breath.   Cardiovascular: Negative for chest pain, palpitations and leg swelling.  Gastrointestinal: Negative for abdominal pain, diarrhea, constipation and abdominal distention.  Genitourinary: Negative for urgency, frequency, decreased urine volume and difficulty urinating.  Musculoskeletal: Negative for back pain, arthralgias and gait problem.  Skin: Negative for color change, pallor and rash.  Neurological: Negative for dizziness, light-headedness, numbness and headaches.  Hematological: Negative for adenopathy. Does not bruise/bleed easily.  Psychiatric/Behavioral: Negative for suicidal ideas, confusion, sleep disturbance, self-injury, dysphoric mood, decreased concentration and agitation.       Objective:    BP 120/72 mmHg  Pulse 72  Temp(Src) 98.4 F (36.9 C) (Other (Comment))  Ht 5\' 3"  (1.6 m)  Wt 125 lb 12.8 oz (57.063 kg)  BMI 22.29 kg/m2  SpO2 98% General appearance: alert, cooperative, appears stated age and no distress Head: Normocephalic, without obvious abnormality, atraumatic Eyes: conjunctivae/corneas clear. PERRL, EOM's intact. Fundi benign. Ears: normal TM's and external ear canals both ears Nose: Nares normal. Septum midline. Mucosa normal. No drainage or sinus tenderness. Throat: lips, mucosa, and tongue normal; teeth and gums normal Neck:  no adenopathy, no carotid bruit, no JVD, supple, symmetrical, trachea midline and thyroid not enlarged, symmetric, no tenderness/mass/nodules Back: symmetric, no curvature. ROM normal. No CVA tenderness. Lungs: clear to auscultation  bilaterally Breasts: normal appearance, no masses or tenderness Heart: regular rate and rhythm, S1, S2 normal, no murmur, click, rub or gallop Abdomen: soft, non-tender; bowel sounds normal; no masses,  no organomegaly Pelvic: deferred---pt prefers not to have one Extremities: extremities normal, atraumatic, no cyanosis or edema Pulses: 2+ and symmetric Skin: Skin color, texture, turgor normal. No rashes or lesions Lymph nodes: Cervical, supraclavicular, and axillary nodes normal. Neurologic: Alert and oriented X 3, normal strength and tone. Normal symmetric reflexes. Normal coordination and gait Psych- no depression, no anxiety      Assessment:    Healthy female exam.       Plan:    check labs ghm utd See After Visit Summary for Counseling Recommendations

## 2014-12-13 NOTE — Progress Notes (Signed)
Pre visit review using our clinic review tool, if applicable. No additional management support is needed unless otherwise documented below in the visit note. 

## 2014-12-13 NOTE — Patient Instructions (Signed)
Preventive Care for Adults A healthy lifestyle and preventive care can promote health and wellness. Preventive health guidelines for women include the following key practices.  A routine yearly physical is a good way to check with your health care provider about your health and preventive screening. It is a chance to share any concerns and updates on your health and to receive a thorough exam.  Visit your dentist for a routine exam and preventive care every 6 months. Brush your teeth twice a day and floss once a day. Good oral hygiene prevents tooth decay and gum disease.  The frequency of eye exams is based on your age, health, family medical history, use of contact lenses, and other factors. Follow your health care provider's recommendations for frequency of eye exams.  Eat a healthy diet. Foods like vegetables, fruits, whole grains, low-fat dairy products, and lean protein foods contain the nutrients you need without too many calories. Decrease your intake of foods high in solid fats, added sugars, and salt. Eat the right amount of calories for you.Get information about a proper diet from your health care provider, if necessary.  Regular physical exercise is one of the most important things you can do for your health. Most adults should get at least 150 minutes of moderate-intensity exercise (any activity that increases your heart rate and causes you to sweat) each week. In addition, most adults need muscle-strengthening exercises on 2 or more days a week.  Maintain a healthy weight. The body mass index (BMI) is a screening tool to identify possible weight problems. It provides an estimate of body fat based on height and weight. Your health care provider can find your BMI and can help you achieve or maintain a healthy weight.For adults 20 years and older:  A BMI below 18.5 is considered underweight.  A BMI of 18.5 to 24.9 is normal.  A BMI of 25 to 29.9 is considered overweight.  A BMI of  30 and above is considered obese.  Maintain normal blood lipids and cholesterol levels by exercising and minimizing your intake of saturated fat. Eat a balanced diet with plenty of fruit and vegetables. Blood tests for lipids and cholesterol should begin at age 76 and be repeated every 5 years. If your lipid or cholesterol levels are high, you are over 50, or you are at high risk for heart disease, you may need your cholesterol levels checked more frequently.Ongoing high lipid and cholesterol levels should be treated with medicines if diet and exercise are not working.  If you smoke, find out from your health care provider how to quit. If you do not use tobacco, do not start.  Lung cancer screening is recommended for adults aged 22-80 years who are at high risk for developing lung cancer because of a history of smoking. A yearly low-dose CT scan of the lungs is recommended for people who have at least a 30-pack-year history of smoking and are a current smoker or have quit within the past 15 years. A pack year of smoking is smoking an average of 1 pack of cigarettes a day for 1 year (for example: 1 pack a day for 30 years or 2 packs a day for 15 years). Yearly screening should continue until the smoker has stopped smoking for at least 15 years. Yearly screening should be stopped for people who develop a health problem that would prevent them from having lung cancer treatment.  If you are pregnant, do not drink alcohol. If you are breastfeeding,  be very cautious about drinking alcohol. If you are not pregnant and choose to drink alcohol, do not have more than 1 drink per day. One drink is considered to be 12 ounces (355 mL) of beer, 5 ounces (148 mL) of wine, or 1.5 ounces (44 mL) of liquor.  Avoid use of street drugs. Do not share needles with anyone. Ask for help if you need support or instructions about stopping the use of drugs.  High blood pressure causes heart disease and increases the risk of  stroke. Your blood pressure should be checked at least every 1 to 2 years. Ongoing high blood pressure should be treated with medicines if weight loss and exercise do not work.  If you are 75-52 years old, ask your health care provider if you should take aspirin to prevent strokes.  Diabetes screening involves taking a blood sample to check your fasting blood sugar level. This should be done once every 3 years, after age 15, if you are within normal weight and without risk factors for diabetes. Testing should be considered at a younger age or be carried out more frequently if you are overweight and have at least 1 risk factor for diabetes.  Breast cancer screening is essential preventive care for women. You should practice "breast self-awareness." This means understanding the normal appearance and feel of your breasts and may include breast self-examination. Any changes detected, no matter how small, should be reported to a health care provider. Women in their 58s and 30s should have a clinical breast exam (CBE) by a health care provider as part of a regular health exam every 1 to 3 years. After age 16, women should have a CBE every year. Starting at age 53, women should consider having a mammogram (breast X-ray test) every year. Women who have a family history of breast cancer should talk to their health care provider about genetic screening. Women at a high risk of breast cancer should talk to their health care providers about having an MRI and a mammogram every year.  Breast cancer gene (BRCA)-related cancer risk assessment is recommended for women who have family members with BRCA-related cancers. BRCA-related cancers include breast, ovarian, tubal, and peritoneal cancers. Having family members with these cancers may be associated with an increased risk for harmful changes (mutations) in the breast cancer genes BRCA1 and BRCA2. Results of the assessment will determine the need for genetic counseling and  BRCA1 and BRCA2 testing.  Routine pelvic exams to screen for cancer are no longer recommended for nonpregnant women who are considered low risk for cancer of the pelvic organs (ovaries, uterus, and vagina) and who do not have symptoms. Ask your health care provider if a screening pelvic exam is right for you.  If you have had past treatment for cervical cancer or a condition that could lead to cancer, you need Pap tests and screening for cancer for at least 20 years after your treatment. If Pap tests have been discontinued, your risk factors (such as having a new sexual partner) need to be reassessed to determine if screening should be resumed. Some women have medical problems that increase the chance of getting cervical cancer. In these cases, your health care provider may recommend more frequent screening and Pap tests.  The HPV test is an additional test that may be used for cervical cancer screening. The HPV test looks for the virus that can cause the cell changes on the cervix. The cells collected during the Pap test can be  tested for HPV. The HPV test could be used to screen women aged 30 years and older, and should be used in women of any age who have unclear Pap test results. After the age of 30, women should have HPV testing at the same frequency as a Pap test.  Colorectal cancer can be detected and often prevented. Most routine colorectal cancer screening begins at the age of 50 years and continues through age 75 years. However, your health care provider may recommend screening at an earlier age if you have risk factors for colon cancer. On a yearly basis, your health care provider may provide home test kits to check for hidden blood in the stool. Use of a small camera at the end of a tube, to directly examine the colon (sigmoidoscopy or colonoscopy), can detect the earliest forms of colorectal cancer. Talk to your health care provider about this at age 50, when routine screening begins. Direct  exam of the colon should be repeated every 5-10 years through age 75 years, unless early forms of pre-cancerous polyps or small growths are found.  People who are at an increased risk for hepatitis B should be screened for this virus. You are considered at high risk for hepatitis B if:  You were born in a country where hepatitis B occurs often. Talk with your health care provider about which countries are considered high risk.  Your parents were born in a high-risk country and you have not received a shot to protect against hepatitis B (hepatitis B vaccine).  You have HIV or AIDS.  You use needles to inject street drugs.  You live with, or have sex with, someone who has hepatitis B.  You get hemodialysis treatment.  You take certain medicines for conditions like cancer, organ transplantation, and autoimmune conditions.  Hepatitis C blood testing is recommended for all people born from 1945 through 1965 and any individual with known risks for hepatitis C.  Practice safe sex. Use condoms and avoid high-risk sexual practices to reduce the spread of sexually transmitted infections (STIs). STIs include gonorrhea, chlamydia, syphilis, trichomonas, herpes, HPV, and human immunodeficiency virus (HIV). Herpes, HIV, and HPV are viral illnesses that have no cure. They can result in disability, cancer, and death.  You should be screened for sexually transmitted illnesses (STIs) including gonorrhea and chlamydia if:  You are sexually active and are younger than 24 years.  You are older than 24 years and your health care provider tells you that you are at risk for this type of infection.  Your sexual activity has changed since you were last screened and you are at an increased risk for chlamydia or gonorrhea. Ask your health care provider if you are at risk.  If you are at risk of being infected with HIV, it is recommended that you take a prescription medicine daily to prevent HIV infection. This is  called preexposure prophylaxis (PrEP). You are considered at risk if:  You are a heterosexual woman, are sexually active, and are at increased risk for HIV infection.  You take drugs by injection.  You are sexually active with a partner who has HIV.  Talk with your health care provider about whether you are at high risk of being infected with HIV. If you choose to begin PrEP, you should first be tested for HIV. You should then be tested every 3 months for as long as you are taking PrEP.  Osteoporosis is a disease in which the bones lose minerals and strength   with aging. This can result in serious bone fractures or breaks. The risk of osteoporosis can be identified using a bone density scan. Women ages 65 years and over and women at risk for fractures or osteoporosis should discuss screening with their health care providers. Ask your health care provider whether you should take a calcium supplement or vitamin D to reduce the rate of osteoporosis.  Menopause can be associated with physical symptoms and risks. Hormone replacement therapy is available to decrease symptoms and risks. You should talk to your health care provider about whether hormone replacement therapy is right for you.  Use sunscreen. Apply sunscreen liberally and repeatedly throughout the day. You should seek shade when your shadow is shorter than you. Protect yourself by wearing long sleeves, pants, a wide-brimmed hat, and sunglasses year round, whenever you are outdoors.  Once a month, do a whole body skin exam, using a mirror to look at the skin on your back. Tell your health care provider of new moles, moles that have irregular borders, moles that are larger than a pencil eraser, or moles that have changed in shape or color.  Stay current with required vaccines (immunizations).  Influenza vaccine. All adults should be immunized every year.  Tetanus, diphtheria, and acellular pertussis (Td, Tdap) vaccine. Pregnant women should  receive 1 dose of Tdap vaccine during each pregnancy. The dose should be obtained regardless of the length of time since the last dose. Immunization is preferred during the 27th-36th week of gestation. An adult who has not previously received Tdap or who does not know her vaccine status should receive 1 dose of Tdap. This initial dose should be followed by tetanus and diphtheria toxoids (Td) booster doses every 10 years. Adults with an unknown or incomplete history of completing a 3-dose immunization series with Td-containing vaccines should begin or complete a primary immunization series including a Tdap dose. Adults should receive a Td booster every 10 years.  Varicella vaccine. An adult without evidence of immunity to varicella should receive 2 doses or a second dose if she has previously received 1 dose. Pregnant females who do not have evidence of immunity should receive the first dose after pregnancy. This first dose should be obtained before leaving the health care facility. The second dose should be obtained 4-8 weeks after the first dose.  Human papillomavirus (HPV) vaccine. Females aged 13-26 years who have not received the vaccine previously should obtain the 3-dose series. The vaccine is not recommended for use in pregnant females. However, pregnancy testing is not needed before receiving a dose. If a female is found to be pregnant after receiving a dose, no treatment is needed. In that case, the remaining doses should be delayed until after the pregnancy. Immunization is recommended for any person with an immunocompromised condition through the age of 26 years if she did not get any or all doses earlier. During the 3-dose series, the second dose should be obtained 4-8 weeks after the first dose. The third dose should be obtained 24 weeks after the first dose and 16 weeks after the second dose.  Zoster vaccine. One dose is recommended for adults aged 60 years or older unless certain conditions are  present.  Measles, mumps, and rubella (MMR) vaccine. Adults born before 1957 generally are considered immune to measles and mumps. Adults born in 1957 or later should have 1 or more doses of MMR vaccine unless there is a contraindication to the vaccine or there is laboratory evidence of immunity to   each of the three diseases. A routine second dose of MMR vaccine should be obtained at least 28 days after the first dose for students attending postsecondary schools, health care workers, or international travelers. People who received inactivated measles vaccine or an unknown type of measles vaccine during 1963-1967 should receive 2 doses of MMR vaccine. People who received inactivated mumps vaccine or an unknown type of mumps vaccine before 1979 and are at high risk for mumps infection should consider immunization with 2 doses of MMR vaccine. For females of childbearing age, rubella immunity should be determined. If there is no evidence of immunity, females who are not pregnant should be vaccinated. If there is no evidence of immunity, females who are pregnant should delay immunization until after pregnancy. Unvaccinated health care workers born before 1957 who lack laboratory evidence of measles, mumps, or rubella immunity or laboratory confirmation of disease should consider measles and mumps immunization with 2 doses of MMR vaccine or rubella immunization with 1 dose of MMR vaccine.  Pneumococcal 13-valent conjugate (PCV13) vaccine. When indicated, a person who is uncertain of her immunization history and has no record of immunization should receive the PCV13 vaccine. An adult aged 19 years or older who has certain medical conditions and has not been previously immunized should receive 1 dose of PCV13 vaccine. This PCV13 should be followed with a dose of pneumococcal polysaccharide (PPSV23) vaccine. The PPSV23 vaccine dose should be obtained at least 8 weeks after the dose of PCV13 vaccine. An adult aged 19  years or older who has certain medical conditions and previously received 1 or more doses of PPSV23 vaccine should receive 1 dose of PCV13. The PCV13 vaccine dose should be obtained 1 or more years after the last PPSV23 vaccine dose.  Pneumococcal polysaccharide (PPSV23) vaccine. When PCV13 is also indicated, PCV13 should be obtained first. All adults aged 65 years and older should be immunized. An adult younger than age 65 years who has certain medical conditions should be immunized. Any person who resides in a nursing home or long-term care facility should be immunized. An adult smoker should be immunized. People with an immunocompromised condition and certain other conditions should receive both PCV13 and PPSV23 vaccines. People with human immunodeficiency virus (HIV) infection should be immunized as soon as possible after diagnosis. Immunization during chemotherapy or radiation therapy should be avoided. Routine use of PPSV23 vaccine is not recommended for American Indians, [place] Natives, or people younger than 65 years unless there are medical conditions that require PPSV23 vaccine. When indicated, people who have unknown immunization and have no record of immunization should receive PPSV23 vaccine. One-time revaccination 5 years after the first dose of PPSV23 is recommended for people aged 19-64 years who have chronic kidney failure, nephrotic syndrome, asplenia, or immunocompromised conditions. People who received 1-2 doses of PPSV23 before age 65 years should receive another dose of PPSV23 vaccine at age 65 years or later if at least 5 years have passed since the previous dose. Doses of PPSV23 are not needed for people immunized with PPSV23 at or after age 65 years.  Meningococcal vaccine. Adults with asplenia or persistent complement component deficiencies should receive 2 doses of quadrivalent meningococcal conjugate (MenACWY-D) vaccine. The doses should be obtained at least 2 months apart.  Microbiologists working with certain meningococcal bacteria, military recruits, people at risk during an outbreak, and people who travel to or live in countries with a high rate of meningitis should be immunized. A first-year college student up through age   21 years who is living in a residence hall should receive a dose if she did not receive a dose on or after her 16th birthday. Adults who have certain high-risk conditions should receive one or more doses of vaccine.  Hepatitis A vaccine. Adults who wish to be protected from this disease, have certain high-risk conditions, work with hepatitis A-infected animals, work in hepatitis A research labs, or travel to or work in countries with a high rate of hepatitis A should be immunized. Adults who were previously unvaccinated and who anticipate close contact with an international adoptee during the first 60 days after arrival in the [place] from a country with a high rate of hepatitis A should be immunized.  Hepatitis B vaccine. Adults who wish to be protected from this disease, have certain high-risk conditions, may be exposed to blood or other infectious body fluids, are household contacts or sex partners of hepatitis B positive people, are clients or workers in certain care facilities, or travel to or work in countries with a high rate of hepatitis B should be immunized.  Haemophilus influenzae type b (Hib) vaccine. A previously unvaccinated person with asplenia or sickle cell disease or having a scheduled splenectomy should receive 1 dose of Hib vaccine. Regardless of previous immunization, a recipient of a hematopoietic stem cell transplant should receive a 3-dose series 6-12 months after her successful transplant. Hib vaccine is not recommended for adults with HIV infection. Preventive Services / Frequency Ages 64 to 68 years  Blood pressure check.** / Every 1 to 2 years.  Lipid and cholesterol check.** / Every 5 years beginning at age  22.  Clinical breast exam.** / Every 3 years for women in their 88s and 53s.  BRCA-related cancer risk assessment.** / For women who have family members with a BRCA-related cancer (breast, ovarian, tubal, or peritoneal cancers).  Pap test.** / Every 2 years from ages 90 through 51. Every 3 years starting at age 21 through age 56 or 3 with a history of 3 consecutive normal Pap tests.  HPV screening.** / Every 3 years from ages 24 through ages 1 to 46 with a history of 3 consecutive normal Pap tests.  Hepatitis C blood test.** / For any individual with known risks for hepatitis C.  Skin self-exam. / Monthly.  Influenza vaccine. / Every year.  Tetanus, diphtheria, and acellular pertussis (Tdap, Td) vaccine.** / Consult your health care provider. Pregnant women should receive 1 dose of Tdap vaccine during each pregnancy. 1 dose of Td every 10 years.  Varicella vaccine.** / Consult your health care provider. Pregnant females who do not have evidence of immunity should receive the first dose after pregnancy.  HPV vaccine. / 3 doses over 6 months, if 72 and younger. The vaccine is not recommended for use in pregnant females. However, pregnancy testing is not needed before receiving a dose.  Measles, mumps, rubella (MMR) vaccine.** / You need at least 1 dose of MMR if you were born in 1957 or later. You may also need a 2nd dose. For females of childbearing age, rubella immunity should be determined. If there is no evidence of immunity, females who are not pregnant should be vaccinated. If there is no evidence of immunity, females who are pregnant should delay immunization until after pregnancy.  Pneumococcal 13-valent conjugate (PCV13) vaccine.** / Consult your health care provider.  Pneumococcal polysaccharide (PPSV23) vaccine.** / 1 to 2 doses if you smoke cigarettes or if you have certain conditions.  Meningococcal vaccine.** /  1 dose if you are age 19 to 21 years and a first-year college  student living in a residence hall, or have one of several medical conditions, you need to get vaccinated against meningococcal disease. You may also need additional booster doses.  Hepatitis A vaccine.** / Consult your health care provider.  Hepatitis B vaccine.** / Consult your health care provider.  Haemophilus influenzae type b (Hib) vaccine.** / Consult your health care provider. Ages 40 to 64 years  Blood pressure check.** / Every 1 to 2 years.  Lipid and cholesterol check.** / Every 5 years beginning at age 20 years.  Lung cancer screening. / Every year if you are aged 55-80 years and have a 30-pack-year history of smoking and currently smoke or have quit within the past 15 years. Yearly screening is stopped once you have quit smoking for at least 15 years or develop a health problem that would prevent you from having lung cancer treatment.  Clinical breast exam.** / Every year after age 40 years.  BRCA-related cancer risk assessment.** / For women who have family members with a BRCA-related cancer (breast, ovarian, tubal, or peritoneal cancers).  Mammogram.** / Every year beginning at age 40 years and continuing for as long as you are in good health. Consult with your health care provider.  Pap test.** / Every 3 years starting at age 30 years through age 65 or 70 years with a history of 3 consecutive normal Pap tests.  HPV screening.** / Every 3 years from ages 30 years through ages 65 to 70 years with a history of 3 consecutive normal Pap tests.  Fecal occult blood test (FOBT) of stool. / Every year beginning at age 50 years and continuing until age 75 years. You may not need to do this test if you get a colonoscopy every 10 years.  Flexible sigmoidoscopy or colonoscopy.** / Every 5 years for a flexible sigmoidoscopy or every 10 years for a colonoscopy beginning at age 50 years and continuing until age 75 years.  Hepatitis C blood test.** / For all people born from 1945 through  1965 and any individual with known risks for hepatitis C.  Skin self-exam. / Monthly.  Influenza vaccine. / Every year.  Tetanus, diphtheria, and acellular pertussis (Tdap/Td) vaccine.** / Consult your health care provider. Pregnant women should receive 1 dose of Tdap vaccine during each pregnancy. 1 dose of Td every 10 years.  Varicella vaccine.** / Consult your health care provider. Pregnant females who do not have evidence of immunity should receive the first dose after pregnancy.  Zoster vaccine.** / 1 dose for adults aged 60 years or older.  Measles, mumps, rubella (MMR) vaccine.** / You need at least 1 dose of MMR if you were born in 1957 or later. You may also need a 2nd dose. For females of childbearing age, rubella immunity should be determined. If there is no evidence of immunity, females who are not pregnant should be vaccinated. If there is no evidence of immunity, females who are pregnant should delay immunization until after pregnancy.  Pneumococcal 13-valent conjugate (PCV13) vaccine.** / Consult your health care provider.  Pneumococcal polysaccharide (PPSV23) vaccine.** / 1 to 2 doses if you smoke cigarettes or if you have certain conditions.  Meningococcal vaccine.** / Consult your health care provider.  Hepatitis A vaccine.** / Consult your health care provider.  Hepatitis B vaccine.** / Consult your health care provider.  Haemophilus influenzae type b (Hib) vaccine.** / Consult your health care provider. Ages 65   years and over  Blood pressure check.** / Every 1 to 2 years.  Lipid and cholesterol check.** / Every 5 years beginning at age 22 years.  Lung cancer screening. / Every year if you are aged 73-80 years and have a 30-pack-year history of smoking and currently smoke or have quit within the past 15 years. Yearly screening is stopped once you have quit smoking for at least 15 years or develop a health problem that would prevent you from having lung cancer  treatment.  Clinical breast exam.** / Every year after age 4 years.  BRCA-related cancer risk assessment.** / For women who have family members with a BRCA-related cancer (breast, ovarian, tubal, or peritoneal cancers).  Mammogram.** / Every year beginning at age 40 years and continuing for as long as you are in good health. Consult with your health care provider.  Pap test.** / Every 3 years starting at age 9 years through age 34 or 91 years with 3 consecutive normal Pap tests. Testing can be stopped between 65 and 70 years with 3 consecutive normal Pap tests and no abnormal Pap or HPV tests in the past 10 years.  HPV screening.** / Every 3 years from ages 57 years through ages 64 or 45 years with a history of 3 consecutive normal Pap tests. Testing can be stopped between 65 and 70 years with 3 consecutive normal Pap tests and no abnormal Pap or HPV tests in the past 10 years.  Fecal occult blood test (FOBT) of stool. / Every year beginning at age 15 years and continuing until age 17 years. You may not need to do this test if you get a colonoscopy every 10 years.  Flexible sigmoidoscopy or colonoscopy.** / Every 5 years for a flexible sigmoidoscopy or every 10 years for a colonoscopy beginning at age 86 years and continuing until age 71 years.  Hepatitis C blood test.** / For all people born from [birth year] through 1963 and any individual with known risks for hepatitis C.  Osteoporosis screening.** / A one-time screening for women ages 83 years and over and women at risk for fractures or osteoporosis.  Skin self-exam. / Monthly.  Influenza vaccine. / Every year.  Tetanus, diphtheria, and acellular pertussis (Tdap/Td) vaccine.** / 1 dose of Td every 10 years.  Varicella vaccine.** / Consult your health care provider.  Zoster vaccine.** / 1 dose for adults aged 61 years or older.  Pneumococcal 13-valent conjugate (PCV13) vaccine.** / Consult your health care provider.  Pneumococcal  polysaccharide (PPSV23) vaccine.** / 1 dose for all adults aged 28 years and older.  Meningococcal vaccine.** / Consult your health care provider.  Hepatitis A vaccine.** / Consult your health care provider.  Hepatitis B vaccine.** / Consult your health care provider.  Haemophilus influenzae type b (Hib) vaccine.** / Consult your health care provider. ** Family history and personal history of risk and conditions may change your health care provider's recommendations. Document Released: 08/03/2001 Document Revised: 10/22/2013 Document Reviewed: 11/02/2010 [facility] Patient Information 2015 [place], [place]. This information is not intended to replace advice given to you by your health care provider. Make sure you discuss any questions you have with your health care provider.

## 2014-12-31 ENCOUNTER — Ambulatory Visit (HOSPITAL_BASED_OUTPATIENT_CLINIC_OR_DEPARTMENT_OTHER): Payer: BC Managed Care – PPO

## 2015-01-01 ENCOUNTER — Other Ambulatory Visit (HOSPITAL_BASED_OUTPATIENT_CLINIC_OR_DEPARTMENT_OTHER): Payer: BC Managed Care – PPO

## 2015-01-07 ENCOUNTER — Ambulatory Visit (HOSPITAL_BASED_OUTPATIENT_CLINIC_OR_DEPARTMENT_OTHER)
Admission: RE | Admit: 2015-01-07 | Discharge: 2015-01-07 | Disposition: A | Discharge status: Home / Self Care | Payer: BC Managed Care – PPO | Source: Ambulatory Visit | Attending: Family Medicine | Admitting: Family Medicine

## 2015-01-07 DIAGNOSIS — E2839 Other primary ovarian failure: Secondary | ICD-10-CM | POA: Diagnosis not present

## 2015-01-07 DIAGNOSIS — Z13820 Encounter for screening for osteoporosis: Secondary | ICD-10-CM | POA: Insufficient documentation

## 2015-01-07 DIAGNOSIS — M810 Age-related osteoporosis without current pathological fracture: Secondary | ICD-10-CM | POA: Diagnosis not present

## 2015-01-07 DIAGNOSIS — Z1231 Encounter for screening mammogram for malignant neoplasm of breast: Secondary | ICD-10-CM | POA: Insufficient documentation

## 2015-01-07 LAB — HM MAMMOGRAPHY

## 2015-01-09 ENCOUNTER — Other Ambulatory Visit: Payer: Self-pay

## 2015-01-09 MED ORDER — ALENDRONATE SODIUM 70 MG PO TABS
70.0000 mg | ORAL_TABLET | ORAL | Status: DC
Start: 2015-01-09 — End: 2018-03-10

## 2015-01-15 ENCOUNTER — Encounter: Payer: Self-pay | Admitting: *Deleted

## 2015-01-22 ENCOUNTER — Encounter: Payer: Self-pay | Admitting: *Deleted

## 2015-12-16 ENCOUNTER — Encounter: Payer: BC Managed Care – PPO | Admitting: Family Medicine

## 2016-01-16 ENCOUNTER — Other Ambulatory Visit: Payer: Self-pay | Admitting: Family Medicine

## 2016-01-16 DIAGNOSIS — Z1231 Encounter for screening mammogram for malignant neoplasm of breast: Secondary | ICD-10-CM

## 2016-01-20 ENCOUNTER — Ambulatory Visit (HOSPITAL_BASED_OUTPATIENT_CLINIC_OR_DEPARTMENT_OTHER)
Admission: RE | Admit: 2016-01-20 | Discharge: 2016-01-20 | Disposition: A | Discharge status: Home / Self Care | Payer: BC Managed Care – PPO | Source: Ambulatory Visit | Attending: Family Medicine | Admitting: Family Medicine

## 2016-01-20 DIAGNOSIS — Z1231 Encounter for screening mammogram for malignant neoplasm of breast: Secondary | ICD-10-CM | POA: Insufficient documentation

## 2016-01-22 ENCOUNTER — Other Ambulatory Visit: Payer: Self-pay | Admitting: Family Medicine

## 2016-01-22 DIAGNOSIS — R928 Other abnormal and inconclusive findings on diagnostic imaging of breast: Secondary | ICD-10-CM

## 2016-01-26 ENCOUNTER — Ambulatory Visit
Admission: RE | Admit: 2016-01-26 | Discharge: 2016-01-26 | Disposition: A | Discharge status: Home / Self Care | Payer: BC Managed Care – PPO | Source: Ambulatory Visit | Attending: Family Medicine | Admitting: Family Medicine

## 2016-01-26 DIAGNOSIS — R928 Other abnormal and inconclusive findings on diagnostic imaging of breast: Secondary | ICD-10-CM

## 2016-02-26 ENCOUNTER — Encounter: Payer: BC Managed Care – PPO | Admitting: Family Medicine

## 2016-03-05 ENCOUNTER — Encounter: Payer: Self-pay | Admitting: Family Medicine

## 2016-03-05 ENCOUNTER — Ambulatory Visit (INDEPENDENT_AMBULATORY_CARE_PROVIDER_SITE_OTHER): Payer: BC Managed Care – PPO | Admitting: Family Medicine

## 2016-03-05 VITALS — BP 138/78 | HR 66 | Temp 98.2°F | Resp 16 | Ht 63.0 in | Wt 130.8 lb

## 2016-03-05 DIAGNOSIS — M25552 Pain in left hip: Secondary | ICD-10-CM | POA: Diagnosis not present

## 2016-03-05 DIAGNOSIS — Z1159 Encounter for screening for other viral diseases: Secondary | ICD-10-CM

## 2016-03-05 DIAGNOSIS — M25551 Pain in right hip: Secondary | ICD-10-CM | POA: Diagnosis not present

## 2016-03-05 DIAGNOSIS — Z0000 Encounter for general adult medical examination without abnormal findings: Secondary | ICD-10-CM

## 2016-03-05 LAB — POCT URINALYSIS DIPSTICK
Bilirubin, UA: NEGATIVE
Blood, UA: NEGATIVE
Glucose, UA: NEGATIVE
Ketones, UA: NEGATIVE
LEUKOCYTES UA: NEGATIVE
NITRITE UA: NEGATIVE
PH UA: 8
PROTEIN UA: NEGATIVE
Spec Grav, UA: 1.015
Urobilinogen, UA: 0.2

## 2016-03-05 LAB — COMPREHENSIVE METABOLIC PANEL
ALT: 13 U/L (ref 0–35)
AST: 19 U/L (ref 0–37)
Albumin: 4.2 g/dL (ref 3.5–5.2)
Alkaline Phosphatase: 94 U/L (ref 39–117)
BILIRUBIN TOTAL: 0.5 mg/dL (ref 0.2–1.2)
BUN: 15 mg/dL (ref 6–23)
CO2: 29 meq/L (ref 19–32)
Calcium: 10.1 mg/dL (ref 8.4–10.5)
Chloride: 107 mEq/L (ref 96–112)
Creatinine, Ser: 0.77 mg/dL (ref 0.40–1.20)
GFR: 80.34 mL/min (ref >60.00)
GLUCOSE: 97 mg/dL (ref 70–99)
POTASSIUM: 4.6 meq/L (ref 3.5–5.1)
Sodium: 143 mEq/L (ref 135–145)
Total Protein: 7.1 g/dL (ref 6.0–8.3)

## 2016-03-05 LAB — CBC WITH DIFFERENTIAL/PLATELET
Basophils Absolute: 0 10*3/uL (ref 0.0–0.1)
Basophils Relative: 0.5 % (ref 0.0–3.0)
EOS ABS: 0.1 10*3/uL (ref 0.0–0.7)
Eosinophils Relative: 1.2 % (ref 0.0–5.0)
HCT: 41.5 % (ref 36.0–46.0)
Hemoglobin: 13.9 g/dL (ref 12.0–15.0)
LYMPHS ABS: 1.4 10*3/uL (ref 0.7–4.0)
Lymphocytes Relative: 32.9 % (ref 12.0–46.0)
MCHC: 33.4 g/dL (ref 30.0–36.0)
MCV: 87.5 fl (ref 78.0–100.0)
MONO ABS: 0.3 10*3/uL (ref 0.1–1.0)
Monocytes Relative: 7.8 % (ref 3.0–12.0)
Neutro Abs: 2.5 10*3/uL (ref 1.4–7.7)
Neutrophils Relative %: 57.6 % (ref 43.0–77.0)
Platelets: 194 10*3/uL (ref 150.0–400.0)
RBC: 4.74 Mil/uL (ref 3.87–5.11)
RDW: 13 % (ref 11.5–15.5)
WBC: 4.3 10*3/uL (ref 4.0–10.5)

## 2016-03-05 LAB — LIPID PANEL
Cholesterol: 199 mg/dL (ref 0–200)
HDL: 91.6 mg/dL (ref >39.00)
LDL Cholesterol: 92 mg/dL (ref 0–99)
NonHDL: 107.81
Total CHOL/HDL Ratio: 2
Triglycerides: 77 mg/dL (ref 0.0–149.0)
VLDL: 15.4 mg/dL (ref 0.0–40.0)

## 2016-03-05 LAB — TSH: TSH: 1.12 u[IU]/mL (ref 0.35–4.50)

## 2016-03-05 NOTE — Progress Notes (Signed)
Subjective:     Abigail Miller is a 65 y.o. female and is here for a comprehensive physical exam. The patient reports problems - hip pain b/l --  unsure how she hurt them.  may have been by walking a dog but she has not walked that dog since January.  pain radiates down legs when she sleeps on her side.  if she is on her back it does not bother her.  .  Social History   Social History  . Marital status: Single    Spouse name: N/A  . Number of children: N/A  . Years of education: N/A   Occupational History  .  [employer]   Social History Main Topics  . Smoking status: Never Smoker  . Smokeless tobacco: Never Used  . Alcohol use No  . Drug use: No  . Sexual activity: No   Other Topics Concern  . Not on file   Social History Narrative   Exercise--gym and walking.   Health Maintenance  Topic Date Due  . PAP SMEAR  08/19/2012  . INFLUENZA VACCINE  03/05/2017 (Originally 01/20/2016)  . HIV Screening  03/05/2017 (Originally 09/01/1965)  . MAMMOGRAM  01/19/2018  . COLONOSCOPY  06/21/2018  . TETANUS/TDAP  12/06/2022  . ZOSTAVAX  Completed  . Hepatitis C Screening  Completed    The following portions of the patient's history were reviewed and updated as appropriate:  She  has a past medical history of No pertinent past medical history and Vein, varicose. She  does not have any pertinent problems on file. She  has a past surgical history that includes Colonoscopy; Tonsillectomy (2000); Cesarean section; Carpal tunnel release (12/03/2011); Trigger finger release (12/03/2011); and Dorsal compartment release (12/03/2011). Her family history includes Arthritis in her father and mother; Breast cancer (age of onset: 9763) in her sister; Cancer (age of onset: 2386) in her father; Hyperlipidemia in her father and mother; Hypertension in her mother. She  reports that she has never smoked. She has never used smokeless tobacco. She reports that she does not drink alcohol or use drugs. She  has a current medication list which includes the following prescription(s): alendronate. Current Outpatient Prescriptions on File Prior to Visit  Medication Sig Dispense Refill  . alendronate (FOSAMAX) 70 MG tablet Take 1 tablet (70 mg total) by mouth every 7 (seven) days. Take with a full glass of water on an empty stomach. (Patient not taking: Reported on 03/05/2016) 4 tablet 11   No current facility-administered medications on file prior to visit.    She is allergic to sulfa antibiotics..  Review of Systems Review of Systems  Constitutional: Negative for activity change, appetite change and fatigue.  HENT: Negative for hearing loss, congestion, tinnitus and ear discharge.  dentist [id number] Eyes: Negative for visual disturbance (see optho q1y -- vision corrected to 20/20 with glasses).  Respiratory: Negative for cough, chest tightness and shortness of breath.   Cardiovascular: Negative for chest pain, palpitations and leg swelling.  Gastrointestinal: Negative for abdominal pain, diarrhea, constipation and abdominal distention.  Genitourinary: Negative for urgency, frequency, decreased urine volume and difficulty urinating.  Musculoskeletal: Negative for back pain, arthralgias and gait problem.  Skin: Negative for color change, pallor and rash.  Neurological: Negative for dizziness, light-headedness, numbness and headaches.  Hematological: Negative for adenopathy. Does not bruise/bleed easily.  Psychiatric/Behavioral: Negative for suicidal ideas, confusion, sleep disturbance, self-injury, dysphoric mood, decreased concentration and agitation.       Objective:    BP 138/78 (  BP Location: Right Arm, Patient Position: Sitting, Cuff Size: Normal)   Pulse 66   Temp 98.2 F (36.8 C) (Oral)   Resp 16   Ht 5\' 3"  (1.6 m)   Wt 130 lb 12.8 oz (59.3 kg)   SpO2 99%   BMI 23.17 kg/m  General appearance: alert, cooperative, appears stated age and no distress Head: Normocephalic, without obvious  abnormality, atraumatic Eyes: conjunctivae/corneas clear. PERRL, EOM's intact. Fundi benign. Ears: normal TM's and external ear canals both ears Nose: Nares normal. Septum midline. Mucosa normal. No drainage or sinus tenderness. Throat: lips, mucosa, and tongue normal; teeth and gums normal Neck: no adenopathy, no carotid bruit, no JVD, supple, symmetrical, trachea midline and thyroid not enlarged, symmetric, no tenderness/mass/nodules Back: symmetric, no curvature. ROM normal. No CVA tenderness. Lungs: clear to auscultation bilaterally Breasts: normal appearance, no masses or tenderness Heart: regular rate and rhythm, S1, S2 normal, no murmur, click, rub or gallop Abdomen: soft, non-tender; bowel sounds normal; no masses,  no organomegaly Pelvic: not indicated; post-menopausal, no abnormal Pap smears in past--- pt does not want one Extremities: extremities normal, atraumatic, no cyanosis or edema Pulses: 2+ and symmetric Skin: Skin color, texture, turgor normal. No rashes or lesions Lymph nodes: Cervical, supraclavicular, and axillary nodes normal. Neurologic:     Assessment:    Healthy female exam.       Plan:     ghm utd Check labs See After Visit Summary for Counseling Recommendations    1. Preventative health care See above - Lipid panel - CBC with Differential/Platelet - POCT urinalysis dipstick - TSH - Comprehensive metabolic panel - Hepatitis C antibody  2. Need for hepatitis C screening test  - Hepatitis C antibody  3. Bilateral hip pain Improving on own with exercise rto if symptoms do not improve

## 2016-03-05 NOTE — Progress Notes (Signed)
Pre visit review using our clinic review tool, if applicable. No additional management support is needed unless otherwise documented below in the visit note. 

## 2016-03-05 NOTE — Patient Instructions (Signed)
Preventive Care for Adults, Female A healthy lifestyle and preventive care can promote health and wellness. Preventive health guidelines for women include the following key practices.  A routine yearly physical is a good way to check with your health care provider about your health and preventive screening. It is a chance to share any concerns and updates on your health and to receive a thorough exam.  Visit your dentist for a routine exam and preventive care every 6 months. Brush your teeth twice a day and floss once a day. Good oral hygiene prevents tooth decay and gum disease.  The frequency of eye exams is based on your age, health, family medical history, use of contact lenses, and other factors. Follow your health care provider's recommendations for frequency of eye exams.  Eat a healthy diet. Foods like vegetables, fruits, whole grains, low-fat dairy products, and lean protein foods contain the nutrients you need without too many calories. Decrease your intake of foods high in solid fats, added sugars, and salt. Eat the right amount of calories for you.Get information about a proper diet from your health care provider, if necessary.  Regular physical exercise is one of the most important things you can do for your health. Most adults should get at least 150 minutes of moderate-intensity exercise (any activity that increases your heart rate and causes you to sweat) each week. In addition, most adults need muscle-strengthening exercises on 2 or more days a week.  Maintain a healthy weight. The body mass index (BMI) is a screening tool to identify possible weight problems. It provides an estimate of body fat based on height and weight. Your health care provider can find your BMI and can help you achieve or maintain a healthy weight.For adults 20 years and older:  A BMI below 18.5 is considered underweight.  A BMI of 18.5 to 24.9 is normal.  A BMI of 25 to 29.9 is considered overweight.  A  BMI of 30 and above is considered obese.  Maintain normal blood lipids and cholesterol levels by exercising and minimizing your intake of saturated fat. Eat a balanced diet with plenty of fruit and vegetables. Blood tests for lipids and cholesterol should begin at age 45 and be repeated every 5 years. If your lipid or cholesterol levels are high, you are over 50, or you are at high risk for heart disease, you may need your cholesterol levels checked more frequently.Ongoing high lipid and cholesterol levels should be treated with medicines if diet and exercise are not working.  If you smoke, find out from your health care provider how to quit. If you do not use tobacco, do not start.  Lung cancer screening is recommended for adults aged 45-80 years who are at high risk for developing lung cancer because of a history of smoking. A yearly low-dose CT scan of the lungs is recommended for people who have at least a 30-pack-year history of smoking and are a current smoker or have quit within the past 15 years. A pack year of smoking is smoking an average of 1 pack of cigarettes a day for 1 year (for example: 1 pack a day for 30 years or 2 packs a day for 15 years). Yearly screening should continue until the smoker has stopped smoking for at least 15 years. Yearly screening should be stopped for people who develop a health problem that would prevent them from having lung cancer treatment.  If you are pregnant, do not drink alcohol. If you are  breastfeeding, be very cautious about drinking alcohol. If you are not pregnant and choose to drink alcohol, do not have more than 1 drink per day. One drink is considered to be 12 ounces (355 mL) of beer, 5 ounces (148 mL) of wine, or 1.5 ounces (44 mL) of liquor.  Avoid use of street drugs. Do not share needles with anyone. Ask for help if you need support or instructions about stopping the use of drugs.  High blood pressure causes heart disease and increases the risk  of stroke. Your blood pressure should be checked at least every 1 to 2 years. Ongoing high blood pressure should be treated with medicines if weight loss and exercise do not work.  If you are 55-79 years old, ask your health care provider if you should take aspirin to prevent strokes.  Diabetes screening is done by taking a blood sample to check your blood glucose level after you have not eaten for a certain period of time (fasting). If you are not overweight and you do not have risk factors for diabetes, you should be screened once every 3 years starting at age 45. If you are overweight or obese and you are 40-70 years of age, you should be screened for diabetes every year as part of your cardiovascular risk assessment.  Breast cancer screening is essential preventive care for women. You should practice "breast self-awareness." This means understanding the normal appearance and feel of your breasts and may include breast self-examination. Any changes detected, no matter how small, should be reported to a health care provider. Women in their 20s and 30s should have a clinical breast exam (CBE) by a health care provider as part of a regular health exam every 1 to 3 years. After age 40, women should have a CBE every year. Starting at age 40, women should consider having a mammogram (breast X-ray test) every year. Women who have a family history of breast cancer should talk to their health care provider about genetic screening. Women at a high risk of breast cancer should talk to their health care providers about having an MRI and a mammogram every year.  Breast cancer gene (BRCA)-related cancer risk assessment is recommended for women who have family members with BRCA-related cancers. BRCA-related cancers include breast, ovarian, tubal, and peritoneal cancers. Having family members with these cancers may be associated with an increased risk for harmful changes (mutations) in the breast cancer genes BRCA1 and  BRCA2. Results of the assessment will determine the need for genetic counseling and BRCA1 and BRCA2 testing.  Your health care provider may recommend that you be screened regularly for cancer of the pelvic organs (ovaries, uterus, and vagina). This screening involves a pelvic examination, including checking for microscopic changes to the surface of your cervix (Pap test). You may be encouraged to have this screening done every 3 years, beginning at age 21.  For women ages 30-65, health care providers may recommend pelvic exams and Pap testing every 3 years, or they may recommend the Pap and pelvic exam, combined with testing for human papilloma virus (HPV), every 5 years. Some types of HPV increase your risk of cervical cancer. Testing for HPV may also be done on women of any age with unclear Pap test results.  Other health care providers may not recommend any screening for nonpregnant women who are considered low risk for pelvic cancer and who do not have symptoms. Ask your health care provider if a screening pelvic exam is right for   you.  If you have had past treatment for cervical cancer or a condition that could lead to cancer, you need Pap tests and screening for cancer for at least 20 years after your treatment. If Pap tests have been discontinued, your risk factors (such as having a new sexual partner) need to be reassessed to determine if screening should resume. Some women have medical problems that increase the chance of getting cervical cancer. In these cases, your health care provider may recommend more frequent screening and Pap tests.  Colorectal cancer can be detected and often prevented. Most routine colorectal cancer screening begins at the age of 50 years and continues through age 75 years. However, your health care provider may recommend screening at an earlier age if you have risk factors for colon cancer. On a yearly basis, your health care provider may provide home test kits to check  for hidden blood in the stool. Use of a small camera at the end of a tube, to directly examine the colon (sigmoidoscopy or colonoscopy), can detect the earliest forms of colorectal cancer. Talk to your health care provider about this at age 50, when routine screening begins. Direct exam of the colon should be repeated every 5-10 years through age 75 years, unless early forms of precancerous polyps or small growths are found.  People who are at an increased risk for hepatitis B should be screened for this virus. You are considered at high risk for hepatitis B if:  You were born in a country where hepatitis B occurs often. Talk with your health care provider about which countries are considered high risk.  Your parents were born in a high-risk country and you have not received a shot to protect against hepatitis B (hepatitis B vaccine).  You have HIV or AIDS.  You use needles to inject street drugs.  You live with, or have sex with, someone who has hepatitis B.  You get hemodialysis treatment.  You take certain medicines for conditions like cancer, organ transplantation, and autoimmune conditions.  Hepatitis C blood testing is recommended for all people born from 1945 through 1965 and any individual with known risks for hepatitis C.  Practice safe sex. Use condoms and avoid high-risk sexual practices to reduce the spread of sexually transmitted infections (STIs). STIs include gonorrhea, chlamydia, syphilis, trichomonas, herpes, HPV, and human immunodeficiency virus (HIV). Herpes, HIV, and HPV are viral illnesses that have no cure. They can result in disability, cancer, and death.  You should be screened for sexually transmitted illnesses (STIs) including gonorrhea and chlamydia if:  You are sexually active and are younger than 24 years.  You are older than 24 years and your health care provider tells you that you are at risk for this type of infection.  Your sexual activity has changed  since you were last screened and you are at an increased risk for chlamydia or gonorrhea. Ask your health care provider if you are at risk.  If you are at risk of being infected with HIV, it is recommended that you take a prescription medicine daily to prevent HIV infection. This is called preexposure prophylaxis (PrEP). You are considered at risk if:  You are sexually active and do not regularly use condoms or know the HIV status of your partner(s).  You take drugs by injection.  You are sexually active with a partner who has HIV.  Talk with your health care provider about whether you are at high risk of being infected with HIV. If   you choose to begin PrEP, you should first be tested for HIV. You should then be tested every 3 months for as long as you are taking PrEP.  Osteoporosis is a disease in which the bones lose minerals and strength with aging. This can result in serious bone fractures or breaks. The risk of osteoporosis can be identified using a bone density scan. Women ages 67 years and over and women at risk for fractures or osteoporosis should discuss screening with their health care providers. Ask your health care provider whether you should take a calcium supplement or vitamin D to reduce the rate of osteoporosis.  Menopause can be associated with physical symptoms and risks. Hormone replacement therapy is available to decrease symptoms and risks. You should talk to your health care provider about whether hormone replacement therapy is right for you.  Use sunscreen. Apply sunscreen liberally and repeatedly throughout the day. You should seek shade when your shadow is shorter than you. Protect yourself by wearing long sleeves, pants, a wide-brimmed hat, and sunglasses year round, whenever you are outdoors.  Once a month, do a whole body skin exam, using a mirror to look at the skin on your back. Tell your health care provider of new moles, moles that have irregular borders, moles that  are larger than a pencil eraser, or moles that have changed in shape or color.  Stay current with required vaccines (immunizations).  Influenza vaccine. All adults should be immunized every year.  Tetanus, diphtheria, and acellular pertussis (Td, Tdap) vaccine. Pregnant women should receive 1 dose of Tdap vaccine during each pregnancy. The dose should be obtained regardless of the length of time since the last dose. Immunization is preferred during the 27th-36th week of gestation. An adult who has not previously received Tdap or who does not know her vaccine status should receive 1 dose of Tdap. This initial dose should be followed by tetanus and diphtheria toxoids (Td) booster doses every 10 years. Adults with an unknown or incomplete history of completing a 3-dose immunization series with Td-containing vaccines should begin or complete a primary immunization series including a Tdap dose. Adults should receive a Td booster every 10 years.  Varicella vaccine. An adult without evidence of immunity to varicella should receive 2 doses or a second dose if she has previously received 1 dose. Pregnant females who do not have evidence of immunity should receive the first dose after pregnancy. This first dose should be obtained before leaving the health care facility. The second dose should be obtained 4-8 weeks after the first dose.  Human papillomavirus (HPV) vaccine. Females aged 13-26 years who have not received the vaccine previously should obtain the 3-dose series. The vaccine is not recommended for use in pregnant females. However, pregnancy testing is not needed before receiving a dose. If a female is found to be pregnant after receiving a dose, no treatment is needed. In that case, the remaining doses should be delayed until after the pregnancy. Immunization is recommended for any person with an immunocompromised condition through the age of 61 years if she did not get any or all doses earlier. During the  3-dose series, the second dose should be obtained 4-8 weeks after the first dose. The third dose should be obtained 24 weeks after the first dose and 16 weeks after the second dose.  Zoster vaccine. One dose is recommended for adults aged 30 years or older unless certain conditions are present.  Measles, mumps, and rubella (MMR) vaccine. Adults born  before 1957 generally are considered immune to measles and mumps. Adults born in 1957 or later should have 1 or more doses of MMR vaccine unless there is a contraindication to the vaccine or there is laboratory evidence of immunity to each of the three diseases. A routine second dose of MMR vaccine should be obtained at least 28 days after the first dose for students attending postsecondary schools, health care workers, or international travelers. People who received inactivated measles vaccine or an unknown type of measles vaccine during 1963-1967 should receive 2 doses of MMR vaccine. People who received inactivated mumps vaccine or an unknown type of mumps vaccine before 1979 and are at high risk for mumps infection should consider immunization with 2 doses of MMR vaccine. For females of childbearing age, rubella immunity should be determined. If there is no evidence of immunity, females who are not pregnant should be vaccinated. If there is no evidence of immunity, females who are pregnant should delay immunization until after pregnancy. Unvaccinated health care workers born before 1957 who lack laboratory evidence of measles, mumps, or rubella immunity or laboratory confirmation of disease should consider measles and mumps immunization with 2 doses of MMR vaccine or rubella immunization with 1 dose of MMR vaccine.  Pneumococcal 13-valent conjugate (PCV13) vaccine. When indicated, a person who is uncertain of his immunization history and has no record of immunization should receive the PCV13 vaccine. All adults 65 years of age and older should receive this  vaccine. An adult aged 19 years or older who has certain medical conditions and has not been previously immunized should receive 1 dose of PCV13 vaccine. This PCV13 should be followed with a dose of pneumococcal polysaccharide (PPSV23) vaccine. Adults who are at high risk for pneumococcal disease should obtain the PPSV23 vaccine at least 8 weeks after the dose of PCV13 vaccine. Adults older than 63 years of age who have normal immune system function should obtain the PPSV23 vaccine dose at least 1 year after the dose of PCV13 vaccine.  Pneumococcal polysaccharide (PPSV23) vaccine. When PCV13 is also indicated, PCV13 should be obtained first. All adults aged 65 years and older should be immunized. An adult younger than age 65 years who has certain medical conditions should be immunized. Any person who resides in a nursing home or long-term care facility should be immunized. An adult smoker should be immunized. People with an immunocompromised condition and certain other conditions should receive both PCV13 and PPSV23 vaccines. People with human immunodeficiency virus (HIV) infection should be immunized as soon as possible after diagnosis. Immunization during chemotherapy or radiation therapy should be avoided. Routine use of PPSV23 vaccine is not recommended for American Indians, [place] Natives, or people younger than 65 years unless there are medical conditions that require PPSV23 vaccine. When indicated, people who have unknown immunization and have no record of immunization should receive PPSV23 vaccine. One-time revaccination 5 years after the first dose of PPSV23 is recommended for people aged 19-64 years who have chronic kidney failure, nephrotic syndrome, asplenia, or immunocompromised conditions. People who received 1-2 doses of PPSV23 before age 65 years should receive another dose of PPSV23 vaccine at age 65 years or later if at least 5 years have passed since the previous dose. Doses of PPSV23 are not  needed for people immunized with PPSV23 at or after age 65 years.  Meningococcal vaccine. Adults with asplenia or persistent complement component deficiencies should receive 2 doses of quadrivalent meningococcal conjugate (MenACWY-D) vaccine. The doses should be obtained   at least 2 months apart. Microbiologists working with certain meningococcal bacteria, [place] recruits, people at risk during an outbreak, and people who travel to or live in countries with a high rate of meningitis should be immunized. A first-year college student up through age 34 years who is living in a residence hall should receive a dose if she did not receive a dose on or after her 16th birthday. Adults who have certain high-risk conditions should receive one or more doses of vaccine.  Hepatitis A vaccine. Adults who wish to be protected from this disease, have certain high-risk conditions, work with hepatitis A-infected animals, work in hepatitis A research labs, or travel to or work in countries with a high rate of hepatitis A should be immunized. Adults who were previously unvaccinated and who anticipate close contact with an international adoptee during the first 60 days after arrival in the [place] from a country with a high rate of hepatitis A should be immunized.  Hepatitis B vaccine. Adults who wish to be protected from this disease, have certain high-risk conditions, may be exposed to blood or other infectious body fluids, are household contacts or sex partners of hepatitis B positive people, are clients or workers in certain care facilities, or travel to or work in countries with a high rate of hepatitis B should be immunized.  Haemophilus influenzae type b (Hib) vaccine. A previously unvaccinated person with asplenia or sickle cell disease or having a scheduled splenectomy should receive 1 dose of Hib vaccine. Regardless of previous immunization, a recipient of a hematopoietic stem cell transplant should receive a  3-dose series 6-12 months after her successful transplant. Hib vaccine is not recommended for adults with HIV infection. Preventive Services / Frequency Ages 35 to 4 years  Blood pressure check.** / Every 3-5 years.  Lipid and cholesterol check.** / Every 5 years beginning at age 60.  Clinical breast exam.** / Every 3 years for women in their 71s and 10s.  BRCA-related cancer risk assessment.** / For women who have family members with a BRCA-related cancer (breast, ovarian, tubal, or peritoneal cancers).  Pap test.** / Every 2 years from ages 76 through 26. Every 3 years starting at age 61 through age 76 or 93 with a history of 3 consecutive normal Pap tests.  HPV screening.** / Every 3 years from ages 37 through ages 60 to 51 with a history of 3 consecutive normal Pap tests.  Hepatitis C blood test.** / For any individual with known risks for hepatitis C.  Skin self-exam. / Monthly.  Influenza vaccine. / Every year.  Tetanus, diphtheria, and acellular pertussis (Tdap, Td) vaccine.** / Consult your health care provider. Pregnant women should receive 1 dose of Tdap vaccine during each pregnancy. 1 dose of Td every 10 years.  Varicella vaccine.** / Consult your health care provider. Pregnant females who do not have evidence of immunity should receive the first dose after pregnancy.  HPV vaccine. / 3 doses over 6 months, if 93 and younger. The vaccine is not recommended for use in pregnant females. However, pregnancy testing is not needed before receiving a dose.  Measles, mumps, rubella (MMR) vaccine.** / You need at least 1 dose of MMR if you were born in 1957 or later. You may also need a 2nd dose. For females of childbearing age, rubella immunity should be determined. If there is no evidence of immunity, females who are not pregnant should be vaccinated. If there is no evidence of immunity, females who are  pregnant should delay immunization until after pregnancy.  Pneumococcal  13-valent conjugate (PCV13) vaccine.** / Consult your health care provider.  Pneumococcal polysaccharide (PPSV23) vaccine.** / 1 to 2 doses if you smoke cigarettes or if you have certain conditions.  Meningococcal vaccine.** / 1 dose if you are age 68 to 8 years and a Market researcher living in a residence hall, or have one of several medical conditions, you need to get vaccinated against meningococcal disease. You may also need additional booster doses.  Hepatitis A vaccine.** / Consult your health care provider.  Hepatitis B vaccine.** / Consult your health care provider.  Haemophilus influenzae type b (Hib) vaccine.** / Consult your health care provider. Ages 7 to 53 years  Blood pressure check.** / Every year.  Lipid and cholesterol check.** / Every 5 years beginning at age 25 years.  Lung cancer screening. / Every year if you are aged 11-80 years and have a 30-pack-year history of smoking and currently smoke or have quit within the past 15 years. Yearly screening is stopped once you have quit smoking for at least 15 years or develop a health problem that would prevent you from having lung cancer treatment.  Clinical breast exam.** / Every year after age 48 years.  BRCA-related cancer risk assessment.** / For women who have family members with a BRCA-related cancer (breast, ovarian, tubal, or peritoneal cancers).  Mammogram.** / Every year beginning at age 41 years and continuing for as long as you are in good health. Consult with your health care provider.  Pap test.** / Every 3 years starting at age 65 years through age 37 or 70 years with a history of 3 consecutive normal Pap tests.  HPV screening.** / Every 3 years from ages 72 years through ages 60 to 40 years with a history of 3 consecutive normal Pap tests.  Fecal occult blood test (FOBT) of stool. / Every year beginning at age 21 years and continuing until age 5 years. You may not need to do this test if you get  a colonoscopy every 10 years.  Flexible sigmoidoscopy or colonoscopy.** / Every 5 years for a flexible sigmoidoscopy or every 10 years for a colonoscopy beginning at age 35 years and continuing until age 48 years.  Hepatitis C blood test.** / For all people born from [birth year] through 1965 and any individual with known risks for hepatitis C.  Skin self-exam. / Monthly.  Influenza vaccine. / Every year.  Tetanus, diphtheria, and acellular pertussis (Tdap/Td) vaccine.** / Consult your health care provider. Pregnant women should receive 1 dose of Tdap vaccine during each pregnancy. 1 dose of Td every 10 years.  Varicella vaccine.** / Consult your health care provider. Pregnant females who do not have evidence of immunity should receive the first dose after pregnancy.  Zoster vaccine.** / 1 dose for adults aged 30 years or older.  Measles, mumps, rubella (MMR) vaccine.** / You need at least 1 dose of MMR if you were born in 1957 or later. You may also need a second dose. For females of childbearing age, rubella immunity should be determined. If there is no evidence of immunity, females who are not pregnant should be vaccinated. If there is no evidence of immunity, females who are pregnant should delay immunization until after pregnancy.  Pneumococcal 13-valent conjugate (PCV13) vaccine.** / Consult your health care provider.  Pneumococcal polysaccharide (PPSV23) vaccine.** / 1 to 2 doses if you smoke cigarettes or if you have certain conditions.  Meningococcal vaccine.** /  Consult your health care provider.  Hepatitis A vaccine.** / Consult your health care provider.  Hepatitis B vaccine.** / Consult your health care provider.  Haemophilus influenzae type b (Hib) vaccine.** / Consult your health care provider. Ages 64 years and over  Blood pressure check.** / Every year.  Lipid and cholesterol check.** / Every 5 years beginning at age 23 years.  Lung cancer screening. / Every year if you  are aged 16-80 years and have a 30-pack-year history of smoking and currently smoke or have quit within the past 15 years. Yearly screening is stopped once you have quit smoking for at least 15 years or develop a health problem that would prevent you from having lung cancer treatment.  Clinical breast exam.** / Every year after age 74 years.  BRCA-related cancer risk assessment.** / For women who have family members with a BRCA-related cancer (breast, ovarian, tubal, or peritoneal cancers).  Mammogram.** / Every year beginning at age 44 years and continuing for as long as you are in good health. Consult with your health care provider.  Pap test.** / Every 3 years starting at age 58 years through age 22 or 39 years with 3 consecutive normal Pap tests. Testing can be stopped between 65 and 70 years with 3 consecutive normal Pap tests and no abnormal Pap or HPV tests in the past 10 years.  HPV screening.** / Every 3 years from ages 64 years through ages 70 or 61 years with a history of 3 consecutive normal Pap tests. Testing can be stopped between 65 and 70 years with 3 consecutive normal Pap tests and no abnormal Pap or HPV tests in the past 10 years.  Fecal occult blood test (FOBT) of stool. / Every year beginning at age 40 years and continuing until age 27 years. You may not need to do this test if you get a colonoscopy every 10 years.  Flexible sigmoidoscopy or colonoscopy.** / Every 5 years for a flexible sigmoidoscopy or every 10 years for a colonoscopy beginning at age 7 years and continuing until age 32 years.  Hepatitis C blood test.** / For all people born from [birth year] through 1965 and any individual with known risks for hepatitis C.  Osteoporosis screening.** / A one-time screening for women ages 30 years and over and women at risk for fractures or osteoporosis.  Skin self-exam. / Monthly.  Influenza vaccine. / Every year.  Tetanus, diphtheria, and acellular pertussis (Tdap/Td)  vaccine.** / 1 dose of Td every 10 years.  Varicella vaccine.** / Consult your health care provider.  Zoster vaccine.** / 1 dose for adults aged 35 years or older.  Pneumococcal 13-valent conjugate (PCV13) vaccine.** / Consult your health care provider.  Pneumococcal polysaccharide (PPSV23) vaccine.** / 1 dose for all adults aged 46 years and older.  Meningococcal vaccine.** / Consult your health care provider.  Hepatitis A vaccine.** / Consult your health care provider.  Hepatitis B vaccine.** / Consult your health care provider.  Haemophilus influenzae type b (Hib) vaccine.** / Consult your health care provider. ** Family history and personal history of risk and conditions may change your health care provider's recommendations.   This information is not intended to replace advice given to you by your health care provider. Make sure you discuss any questions you have with your health care provider.   Document Released: 08/03/2001 Document Revised: 06/28/2014 Document Reviewed: 11/02/2010 Elsevier Interactive Patient Education [employer].

## 2016-03-06 LAB — HEPATITIS C ANTIBODY: HCV Ab: NEGATIVE

## 2016-03-07 DIAGNOSIS — M25551 Pain in right hip: Secondary | ICD-10-CM | POA: Insufficient documentation

## 2016-03-07 DIAGNOSIS — M25552 Pain in left hip: Secondary | ICD-10-CM

## 2016-03-07 NOTE — Assessment & Plan Note (Signed)
Improving--- con't otc meds rto if pain cont'

## 2017-01-25 ENCOUNTER — Other Ambulatory Visit: Payer: Self-pay | Admitting: Family Medicine

## 2017-01-25 DIAGNOSIS — Z1231 Encounter for screening mammogram for malignant neoplasm of breast: Secondary | ICD-10-CM

## 2017-02-04 ENCOUNTER — Ambulatory Visit
Admission: RE | Admit: 2017-02-04 | Discharge: 2017-02-04 | Disposition: A | Discharge status: Home / Self Care | Payer: BC Managed Care – PPO | Source: Ambulatory Visit | Attending: Family Medicine | Admitting: Family Medicine

## 2017-02-04 DIAGNOSIS — Z1231 Encounter for screening mammogram for malignant neoplasm of breast: Secondary | ICD-10-CM

## 2017-03-07 ENCOUNTER — Encounter: Payer: Self-pay | Admitting: Family Medicine

## 2017-03-07 ENCOUNTER — Ambulatory Visit (INDEPENDENT_AMBULATORY_CARE_PROVIDER_SITE_OTHER): Payer: BC Managed Care – PPO | Admitting: Family Medicine

## 2017-03-07 VITALS — BP 122/74 | HR 81 | Temp 98.0°F | Ht 63.0 in | Wt 127.6 lb

## 2017-03-07 DIAGNOSIS — Z0000 Encounter for general adult medical examination without abnormal findings: Secondary | ICD-10-CM

## 2017-03-07 LAB — COMPREHENSIVE METABOLIC PANEL
ALT: 12 U/L (ref 0–35)
AST: 20 U/L (ref 0–37)
Albumin: 4.2 g/dL (ref 3.5–5.2)
Alkaline Phosphatase: 94 U/L (ref 39–117)
BUN: 13 mg/dL (ref 6–23)
CO2: 28 meq/L (ref 19–32)
Calcium: 10.3 mg/dL (ref 8.4–10.5)
Chloride: 107 mEq/L (ref 96–112)
Creatinine, Ser: 0.79 mg/dL (ref 0.40–1.20)
GFR: 77.75 mL/min (ref >60.00)
GLUCOSE: 93 mg/dL (ref 70–99)
POTASSIUM: 3.8 meq/L (ref 3.5–5.1)
Sodium: 141 mEq/L (ref 135–145)
Total Bilirubin: 0.5 mg/dL (ref 0.2–1.2)
Total Protein: 7.2 g/dL (ref 6.0–8.3)

## 2017-03-07 LAB — CBC WITH DIFFERENTIAL/PLATELET
Basophils Absolute: 0 10*3/uL (ref 0.0–0.1)
Basophils Relative: 1.1 % (ref 0.0–3.0)
Eosinophils Absolute: 0.1 10*3/uL (ref 0.0–0.7)
Eosinophils Relative: 1.8 % (ref 0.0–5.0)
HEMATOCRIT: 41.8 % (ref 36.0–46.0)
HEMOGLOBIN: 14 g/dL (ref 12.0–15.0)
LYMPHS PCT: 31.7 % (ref 12.0–46.0)
Lymphs Abs: 1.2 10*3/uL (ref 0.7–4.0)
MCHC: 33.5 g/dL (ref 30.0–36.0)
MCV: 88.8 fl (ref 78.0–100.0)
Monocytes Absolute: 0.4 10*3/uL (ref 0.1–1.0)
Monocytes Relative: 9.6 % (ref 3.0–12.0)
Neutro Abs: 2.2 10*3/uL (ref 1.4–7.7)
Neutrophils Relative %: 55.8 % (ref 43.0–77.0)
Platelets: 194 10*3/uL (ref 150.0–400.0)
RBC: 4.7 Mil/uL (ref 3.87–5.11)
RDW: 13.2 % (ref 11.5–15.5)
WBC: 3.9 10*3/uL — ABNORMAL LOW (ref 4.0–10.5)

## 2017-03-07 LAB — LIPID PANEL
CHOL/HDL RATIO: 2
Cholesterol: 214 mg/dL — ABNORMAL HIGH (ref 0–200)
HDL: 104.4 mg/dL (ref >39.00)
LDL Cholesterol: 94 mg/dL (ref 0–99)
NonHDL: 109.68
TRIGLYCERIDES: 76 mg/dL (ref 0.0–149.0)
VLDL: 15.2 mg/dL (ref 0.0–40.0)

## 2017-03-07 LAB — TSH: TSH: 1.81 u[IU]/mL (ref 0.35–4.50)

## 2017-03-07 NOTE — Progress Notes (Signed)
Subjective:     Kent Braunschweig is a 66 y.o. female and is here for a comprehensive physical exam. The patient reports no problems.  Social History   Social History  . Marital status: Single    Spouse name: N/A  . Number of children: N/A  . Years of education: N/A   Occupational History  .  [employer]   Social History Main Topics  . Smoking status: Never Smoker  . Smokeless tobacco: Never Used  . Alcohol use No  . Drug use: No  . Sexual activity: No   Other Topics Concern  . Not on file   Social History Narrative   Exercise--gym and walking.   Health Maintenance  Topic Date Due  . HIV Screening  09/01/1967  . PAP SMEAR  08/19/2012  . INFLUENZA VACCINE  09/18/2017 (Originally 01/19/2017)  . COLONOSCOPY  06/21/2018  . MAMMOGRAM  02/05/2019  . TETANUS/TDAP  12/06/2022  . Hepatitis C Screening  Completed    The following portions of the patient's history were reviewed and updated as appropriate:  She  has a past medical history of No pertinent past medical history and Vein, varicose. She  does not have any pertinent problems on file. She  has a past surgical history that includes Colonoscopy; Tonsillectomy (2000); Cesarean section; Carpal tunnel release (12/03/2011); Trigger finger release (12/03/2011); and Dorsal compartment release (12/03/2011). Her family history includes Arthritis in her father and mother; Breast cancer (age of onset: 20) in her sister; Cancer (age of onset: 30) in her father; Hyperlipidemia in her father and mother; Hypertension in her mother. She  reports that she has never smoked. She has never used smokeless tobacco. She reports that she does not drink alcohol or use drugs. She has a current medication list which includes the following prescription(s): alendronate. Current Outpatient Prescriptions on File Prior to Visit  Medication Sig Dispense Refill  . alendronate (FOSAMAX) 70 MG tablet Take 1 tablet (70 mg total) by mouth every 7 (seven)  days. Take with a full glass of water on an empty stomach. (Patient not taking: Reported on 03/05/2016) 4 tablet 11   No current facility-administered medications on file prior to visit.    She is allergic to sulfa antibiotics..  Review of Systems Review of Systems  Constitutional: Negative for activity change, appetite change and fatigue.  HENT: Negative for hearing loss, congestion, tinnitus and ear discharge.  dentist q74m Eyes: Negative for visual disturbance (see optho q1y -- vision corrected to 20/20 with glasses).  Respiratory: Negative for cough, chest tightness and shortness of breath.   Cardiovascular: Negative for chest pain, palpitations and leg swelling.  Gastrointestinal: Negative for abdominal pain, diarrhea, constipation and abdominal distention.  Genitourinary: Negative for urgency, frequency, decreased urine volume and difficulty urinating.  Musculoskeletal: Negative for back pain, arthralgias and gait problem.  Skin: Negative for color change, pallor and rash.  Neurological: Negative for dizziness, light-headedness, numbness and headaches.  Hematological: Negative for adenopathy. Does not bruise/bleed easily.  Psychiatric/Behavioral: Negative for suicidal ideas, confusion, sleep disturbance, self-injury, dysphoric mood, decreased concentration and agitation.       Objective:    BP 122/74 (BP Location: Right Arm, Patient Position: Sitting, Cuff Size: Normal)   Pulse 81   Temp 98 F (36.7 C) (Oral)   Ht  (1.6 m)   Wt 127 lb 9.6 oz (57.9 kg)   SpO2 97%   BMI 22.60 kg/m  General appearance: alert, cooperative, appears stated age and no distress Head:  Normocephalic, without obvious abnormality, atraumatic Eyes: conjunctivae/corneas clear. PERRL, EOM's intact. Fundi benign. Ears: normal TM's and external ear canals both ears Nose: Nares normal. Septum midline. Mucosa normal. No drainage or sinus tenderness. Throat: lips, mucosa, and tongue normal; teeth and  gums normal Neck: no adenopathy, no carotid bruit, no JVD, supple, symmetrical, trachea midline and thyroid not enlarged, symmetric, no tenderness/mass/nodules Back: symmetric, no curvature. ROM normal. No CVA tenderness. Lungs: clear to auscultation bilaterally Breasts: normal appearance, no masses or tenderness Heart: regular rate and rhythm, S1, S2 normal, no murmur, click, rub or gallop Abdomen: soft, non-tender; bowel sounds normal; no masses,  no organomegaly Pelvic: not indicated; post-menopausal, no abnormal Pap smears in past Extremities: extremities normal, atraumatic, no cyanosis or edema Pulses: 2+ and symmetric Skin: Skin color, texture, turgor normal. No rashes or lesions Lymph nodes: Cervical, supraclavicular, and axillary nodes normal. Neurologic: Alert and oriented X 3, normal strength and tone. Normal symmetric reflexes. Normal coordination and gait    Assessment:    Healthy female exam.      Plan:    ghm utd Check labs See After Visit Summary for Counseling Recommendations    1. Preventative health care See above - CBC with Differential/Platelet - Lipid panel - TSH - Comprehensive metabolic panel

## 2017-03-07 NOTE — Patient Instructions (Signed)
Preventive Care 40-64 Years, Female Preventive care refers to lifestyle choices and visits with your health care provider that can promote health and wellness. What does preventive care include?  A yearly physical exam. This is also called an annual well check.  Dental exams once or twice a year.  Routine eye exams. Ask your health care provider how often you should have your eyes checked.  Personal lifestyle choices, including: ? Daily care of your teeth and gums. ? Regular physical activity. ? Eating a healthy diet. ? Avoiding tobacco and drug use. ? Limiting alcohol use. ? Practicing safe sex. ? Taking low-dose aspirin daily starting at age 58. ? Taking vitamin and mineral supplements as recommended by your health care provider. What happens during an annual well check? The services and screenings done by your health care provider during your annual well check will depend on your age, overall health, lifestyle risk factors, and family history of disease. Counseling Your health care provider may ask you questions about your:  Alcohol use.  Tobacco use.  Drug use.  Emotional well-being.  Home and relationship well-being.  Sexual activity.  Eating habits.  Work and work Statistician.  Method of birth control.  Menstrual cycle.  Pregnancy history.  Screening You may have the following tests or measurements:  Height, weight, and BMI.  Blood pressure.  Lipid and cholesterol levels. These may be checked every 5 years, or more frequently if you are over 81 years old.  Skin check.  Lung cancer screening. You may have this screening every year starting at age 78 if you have a 30-pack-year history of smoking and currently smoke or have quit within the past 15 years.  Fecal occult blood test (FOBT) of the stool. You may have this test every year starting at age 65.  Flexible sigmoidoscopy or colonoscopy. You may have a sigmoidoscopy every 5 years or a colonoscopy  every 10 years starting at age 30.  Hepatitis C blood test.  Hepatitis B blood test.  Sexually transmitted disease (STD) testing.  Diabetes screening. This is done by checking your blood sugar (glucose) after you have not eaten for a while (fasting). You may have this done every 1-3 years.  Mammogram. This may be done every 1-2 years. Talk to your health care provider about when you should start having regular mammograms. This may depend on whether you have a family history of breast cancer.  BRCA-related cancer screening. This may be done if you have a family history of breast, ovarian, tubal, or peritoneal cancers.  Pelvic exam and Pap test. This may be done every 3 years starting at age 80. Starting at age 36, this may be done every 5 years if you have a Pap test in combination with an HPV test.  Bone density scan. This is done to screen for osteoporosis. You may have this scan if you are at high risk for osteoporosis.  Discuss your test results, treatment options, and if necessary, the need for more tests with your health care provider. Vaccines Your health care provider may recommend certain vaccines, such as:  Influenza vaccine. This is recommended every year.  Tetanus, diphtheria, and acellular pertussis (Tdap, Td) vaccine. You may need a Td booster every 10 years.  Varicella vaccine. You may need this if you have not been vaccinated.  Zoster vaccine. You may need this after age 5.  Measles, mumps, and rubella (MMR) vaccine. You may need at least one dose of MMR if you were born in  1955 or later. You may also need a second dose.  Pneumococcal 13-valent conjugate (PCV13) vaccine. You may need this if you have certain conditions and were not previously vaccinated.  Pneumococcal polysaccharide (PPSV23) vaccine. You may need one or two doses if you smoke cigarettes or if you have certain conditions.  Meningococcal vaccine. You may need this if you have certain  conditions.  Hepatitis A vaccine. You may need this if you have certain conditions or if you travel or work in places where you may be exposed to hepatitis A.  Hepatitis B vaccine. You may need this if you have certain conditions or if you travel or work in places where you may be exposed to hepatitis B.  Haemophilus influenzae type b (Hib) vaccine. You may need this if you have certain conditions.  Talk to your health care provider about which screenings and vaccines you need and how often you need them. This information is not intended to replace advice given to you by your health care provider. Make sure you discuss any questions you have with your health care provider. Document Released: 07/04/2015 Document Revised: 02/25/2016 Document Reviewed: 04/08/2015 Elsevier Interactive Patient Education  2017 [employer].

## 2017-11-01 LAB — HM COLONOSCOPY

## 2017-11-09 ENCOUNTER — Encounter: Payer: Self-pay | Admitting: Family Medicine

## 2018-01-17 ENCOUNTER — Other Ambulatory Visit: Payer: Self-pay | Admitting: Family Medicine

## 2018-01-17 DIAGNOSIS — Z1231 Encounter for screening mammogram for malignant neoplasm of breast: Secondary | ICD-10-CM

## 2018-02-09 ENCOUNTER — Ambulatory Visit
Admission: RE | Admit: 2018-02-09 | Discharge: 2018-02-09 | Disposition: A | Discharge status: Home / Self Care | Payer: BC Managed Care – PPO | Source: Ambulatory Visit | Attending: Family Medicine | Admitting: Family Medicine

## 2018-02-09 DIAGNOSIS — Z1231 Encounter for screening mammogram for malignant neoplasm of breast: Secondary | ICD-10-CM

## 2018-03-10 ENCOUNTER — Ambulatory Visit (INDEPENDENT_AMBULATORY_CARE_PROVIDER_SITE_OTHER): Payer: BC Managed Care – PPO | Admitting: Family Medicine

## 2018-03-10 ENCOUNTER — Encounter: Payer: Self-pay | Admitting: Family Medicine

## 2018-03-10 VITALS — BP 143/69 | HR 100 | Temp 98.5°F | Resp 16 | Ht 62.8 in | Wt 130.8 lb

## 2018-03-10 DIAGNOSIS — Z23 Encounter for immunization: Secondary | ICD-10-CM | POA: Diagnosis not present

## 2018-03-10 DIAGNOSIS — Z0000 Encounter for general adult medical examination without abnormal findings: Secondary | ICD-10-CM

## 2018-03-10 DIAGNOSIS — E2839 Other primary ovarian failure: Secondary | ICD-10-CM

## 2018-03-10 LAB — LIPID PANEL
CHOLESTEROL: 214 mg/dL — AB (ref 0–200)
HDL: 81 mg/dL (ref >39.00)
LDL CALC: 113 mg/dL — AB (ref 0–99)
NonHDL: 133.4
Total CHOL/HDL Ratio: 3
Triglycerides: 100 mg/dL (ref 0.0–149.0)
VLDL: 20 mg/dL (ref 0.0–40.0)

## 2018-03-10 LAB — COMPREHENSIVE METABOLIC PANEL
ALK PHOS: 97 U/L (ref 39–117)
ALT: 15 U/L (ref 0–35)
AST: 20 U/L (ref 0–37)
Albumin: 4.4 g/dL (ref 3.5–5.2)
BILIRUBIN TOTAL: 0.6 mg/dL (ref 0.2–1.2)
BUN: 16 mg/dL (ref 6–23)
CALCIUM: 10.8 mg/dL — AB (ref 8.4–10.5)
CO2: 31 mEq/L (ref 19–32)
CREATININE: 0.77 mg/dL (ref 0.40–1.20)
Chloride: 105 mEq/L (ref 96–112)
GFR: 79.83 mL/min (ref >60.00)
Glucose, Bld: 93 mg/dL (ref 70–99)
Potassium: 5.3 mEq/L — ABNORMAL HIGH (ref 3.5–5.1)
Sodium: 142 mEq/L (ref 135–145)
TOTAL PROTEIN: 7 g/dL (ref 6.0–8.3)

## 2018-03-10 LAB — CBC WITH DIFFERENTIAL/PLATELET
Basophils Absolute: 0 10*3/uL (ref 0.0–0.1)
Basophils Relative: 0.8 % (ref 0.0–3.0)
EOS PCT: 1.5 % (ref 0.0–5.0)
Eosinophils Absolute: 0.1 10*3/uL (ref 0.0–0.7)
HEMATOCRIT: 44.1 % (ref 36.0–46.0)
HEMOGLOBIN: 14.6 g/dL (ref 12.0–15.0)
LYMPHS PCT: 32.6 % (ref 12.0–46.0)
Lymphs Abs: 1.4 10*3/uL (ref 0.7–4.0)
MCHC: 33.2 g/dL (ref 30.0–36.0)
MCV: 87 fl (ref 78.0–100.0)
Monocytes Absolute: 0.4 10*3/uL (ref 0.1–1.0)
Monocytes Relative: 8.6 % (ref 3.0–12.0)
Neutro Abs: 2.4 10*3/uL (ref 1.4–7.7)
Neutrophils Relative %: 56.5 % (ref 43.0–77.0)
Platelets: 194 10*3/uL (ref 150.0–400.0)
RBC: 5.07 Mil/uL (ref 3.87–5.11)
RDW: 13.5 % (ref 11.5–15.5)
WBC: 4.2 10*3/uL (ref 4.0–10.5)

## 2018-03-10 LAB — TSH: TSH: 1.51 u[IU]/mL (ref 0.35–4.50)

## 2018-03-10 NOTE — Progress Notes (Signed)
Subjective:     Abigail Miller is a 67 y.o. female and is here for a comprehensive physical exam. The patient reports no problems.  Social History   Socioeconomic History  . Marital status: Single    Spouse name: Not on file  . Number of children: Not on file  . Years of education: Not on file  . Highest education level: Not on file  Occupational History    Employer: [employer]  Social Needs  . Financial resource strain: Not on file  . Food insecurity:    Worry: Not on file    Inability: Not on file  . Transportation needs:    Medical: Not on file    Non-medical: Not on file  Tobacco Use  . Smoking status: Never Smoker  . Smokeless tobacco: Never Used  Substance and Sexual Activity  . Alcohol use: No  . Drug use: No  . Sexual activity: Never    Partners: Male  Lifestyle  . Physical activity:    Days per week: Not on file    Minutes per session: Not on file  . Stress: Not on file  Relationships  . Social connections:    Talks on phone: Not on file    Gets together: Not on file    Attends religious service: Not on file    Active member of club or organization: Not on file    Attends meetings of clubs or organizations: Not on file    Relationship status: Not on file  . Intimate partner violence:    Fear of current or ex partner: Not on file    Emotionally abused: Not on file    Physically abused: Not on file    Forced sexual activity: Not on file  Other Topics Concern  . Not on file  Social History Narrative   Exercise--gym and walking.   Health Maintenance  Topic Date Due  . PNA vac Low Risk Adult (1 of 2 - PCV13) 08/31/2017  . INFLUENZA VACCINE  01/19/2018  . HIV Screening  03/10/2024 (Originally 09[birth year]01[birth year]1967)  . MAMMOGRAM  02/10/2019  . TETANUS/TDAP  12/06/2022  . COLONOSCOPY  11/02/2027  . DEXA SCAN  Completed  . Hepatitis C Screening  Completed    The following portions of the patient's history were reviewed and updated as appropriate:  She   has a past medical history of No pertinent past medical history and Vein, varicose. She does not have any pertinent problems on file. She  has a past surgical history that includes Colonoscopy; Tonsillectomy (2000); Cesarean section; Carpal tunnel release (12/03/2011); Trigger finger release (12/03/2011); and Dorsal compartment release (12/03/2011). Her family history includes Arthritis in her father and mother; Breast cancer (age of onset: 56) in her sister; Cancer (age of onset: 2) in her father; Hyperlipidemia in her father and mother; Hypertension in her mother. She  reports that she has never smoked. She has never used smokeless tobacco. She reports that she does not drink alcohol or use drugs. She currently has no medications in their medication list. No current outpatient medications on file prior to visit.   No current facility-administered medications on file prior to visit.    She is allergic to sulfa antibiotics..  Review of Systems Review of Systems  Constitutional: Negative for activity change, appetite change and fatigue.  HENT: Negative for hearing loss, congestion, tinnitus and ear discharge.  dentist q9m Eyes: Negative for visual disturbance (see optho q1y -- vision corrected to 20/20 with glasses).  Respiratory: Negative for cough, chest tightness and shortness of breath.   Cardiovascular: Negative for chest pain, palpitations and leg swelling.  Gastrointestinal: Negative for abdominal pain, diarrhea, constipation and abdominal distention.  Genitourinary: Negative for urgency, frequency, decreased urine volume and difficulty urinating.  Musculoskeletal: Negative for back pain, arthralgias and gait problem.  Skin: Negative for color change, pallor and rash.  Neurological: Negative for dizziness, light-headedness, numbness and headaches.  Hematological: Negative for adenopathy. Does not bruise/bleed easily.  Psychiatric/Behavioral: Negative for suicidal ideas, confusion, sleep  disturbance, self-injury, dysphoric mood, decreased concentration and agitation.      Objective:    BP (!) 143/69 (BP Location: Right Arm, Cuff Size: Normal)   Pulse 100   Temp 98.5 F (36.9 C) (Oral)   Resp 16   Ht 5' 2.8" (1.595 m)   Wt 130 lb 12.8 oz (59.3 kg)   SpO2 98%   BMI 23.32 kg/m  General appearance: alert, cooperative, appears stated age and no distress Head: Normocephalic, without obvious abnormality, atraumatic Eyes: conjunctivae/corneas clear. PERRL, EOM's intact. Fundi benign. Ears: normal TM's and external ear canals both ears Nose: Nares normal. Septum midline. Mucosa normal. No drainage or sinus tenderness. Throat: lips, mucosa, and tongue normal; teeth and gums normal Neck: no adenopathy, no carotid bruit, no JVD, supple, symmetrical, trachea midline and thyroid not enlarged, symmetric, no tenderness/mass/nodules Back: symmetric, no curvature. ROM normal. No CVA tenderness. Lungs: clear to auscultation bilaterally Breasts: normal appearance, no masses or tenderness Heart: regular rate and rhythm, S1, S2 normal, no murmur, click, rub or gallop Abdomen: soft, non-tender; bowel sounds normal; no masses,  no organomegaly Pelvic: not indicated; post-menopausal, no abnormal Pap smears in past Extremities: extremities normal, atraumatic, no cyanosis or edema Pulses: 2+ and symmetric Skin: Skin color, texture, turgor normal. No rashes or lesions Lymph nodes: Cervical, supraclavicular, and axillary nodes normal. Neurologic: Alert and oriented X 3, normal strength and tone. Normal symmetric reflexes. Normal coordination and gait    Assessment:    Healthy female exam.      Plan:    ghm utd Check labs rto 1 year  See After Visit Summary for Counseling Recommendations    1. Preventative health care See above - CBC with Differential/Platelet - Comprehensive metabolic panel - Lipid panel - TSH  2. Estrogen deficiency   - DG Bone Density; Future  3. Need  for shingles vaccine   - Varicella-zoster vaccine IM (Shingrix)  4. Need for pneumococcal vaccine   - Pneumococcal conjugate vaccine 13-valent

## 2018-03-10 NOTE — Patient Instructions (Signed)
Preventive Care 40-64 Years, Female Preventive care refers to lifestyle choices and visits with your health care provider that can promote health and wellness. What does preventive care include?  A yearly physical exam. This is also called an annual well check.  Dental exams once or twice a year.  Routine eye exams. Ask your health care provider how often you should have your eyes checked.  Personal lifestyle choices, including: ? Daily care of your teeth and gums. ? Regular physical activity. ? Eating a healthy diet. ? Avoiding tobacco and drug use. ? Limiting alcohol use. ? Practicing safe sex. ? Taking low-dose aspirin daily starting at age 58. ? Taking vitamin and mineral supplements as recommended by your health care provider. What happens during an annual well check? The services and screenings done by your health care provider during your annual well check will depend on your age, overall health, lifestyle risk factors, and family history of disease. Counseling Your health care provider may ask you questions about your:  Alcohol use.  Tobacco use.  Drug use.  Emotional well-being.  Home and relationship well-being.  Sexual activity.  Eating habits.  Work and work Statistician.  Method of birth control.  Menstrual cycle.  Pregnancy history.  Screening You may have the following tests or measurements:  Height, weight, and BMI.  Blood pressure.  Lipid and cholesterol levels. These may be checked every 5 years, or more frequently if you are over 81 years old.  Skin check.  Lung cancer screening. You may have this screening every year starting at age 78 if you have a 30-pack-year history of smoking and currently smoke or have quit within the past 15 years.  Fecal occult blood test (FOBT) of the stool. You may have this test every year starting at age 65.  Flexible sigmoidoscopy or colonoscopy. You may have a sigmoidoscopy every 5 years or a colonoscopy  every 10 years starting at age 30.  Hepatitis C blood test.  Hepatitis B blood test.  Sexually transmitted disease (STD) testing.  Diabetes screening. This is done by checking your blood sugar (glucose) after you have not eaten for a while (fasting). You may have this done every 1-3 years.  Mammogram. This may be done every 1-2 years. Talk to your health care provider about when you should start having regular mammograms. This may depend on whether you have a family history of breast cancer.  BRCA-related cancer screening. This may be done if you have a family history of breast, ovarian, tubal, or peritoneal cancers.  Pelvic exam and Pap test. This may be done every 3 years starting at age 80. Starting at age 36, this may be done every 5 years if you have a Pap test in combination with an HPV test.  Bone density scan. This is done to screen for osteoporosis. You may have this scan if you are at high risk for osteoporosis.  Discuss your test results, treatment options, and if necessary, the need for more tests with your health care provider. Vaccines Your health care provider may recommend certain vaccines, such as:  Influenza vaccine. This is recommended every year.  Tetanus, diphtheria, and acellular pertussis (Tdap, Td) vaccine. You may need a Td booster every 10 years.  Varicella vaccine. You may need this if you have not been vaccinated.  Zoster vaccine. You may need this after age 5.  Measles, mumps, and rubella (MMR) vaccine. You may need at least one dose of MMR if you were born in  1957 or later. You may also need a second dose.  Pneumococcal 13-valent conjugate (PCV13) vaccine. You may need this if you have certain conditions and were not previously vaccinated.  Pneumococcal polysaccharide (PPSV23) vaccine. You may need one or two doses if you smoke cigarettes or if you have certain conditions.  Meningococcal vaccine. You may need this if you have certain  conditions.  Hepatitis A vaccine. You may need this if you have certain conditions or if you travel or work in places where you may be exposed to hepatitis A.  Hepatitis B vaccine. You may need this if you have certain conditions or if you travel or work in places where you may be exposed to hepatitis B.  Haemophilus influenzae type b (Hib) vaccine. You may need this if you have certain conditions.  Talk to your health care provider about which screenings and vaccines you need and how often you need them. This information is not intended to replace advice given to you by your health care provider. Make sure you discuss any questions you have with your health care provider. Document Released: 07/04/2015 Document Revised: 02/25/2016 Document Reviewed: 04/08/2015 Elsevier Interactive Patient Education  2018 Elsevier Inc.  

## 2018-04-26 ENCOUNTER — Ambulatory Visit (HOSPITAL_BASED_OUTPATIENT_CLINIC_OR_DEPARTMENT_OTHER)
Admission: RE | Admit: 2018-04-26 | Discharge: 2018-04-26 | Disposition: A | Discharge status: Home / Self Care | Payer: BC Managed Care – PPO | Source: Ambulatory Visit | Attending: Family Medicine | Admitting: Family Medicine

## 2018-04-26 DIAGNOSIS — E2839 Other primary ovarian failure: Secondary | ICD-10-CM | POA: Insufficient documentation

## 2018-04-26 DIAGNOSIS — M810 Age-related osteoporosis without current pathological fracture: Secondary | ICD-10-CM | POA: Insufficient documentation

## 2018-05-17 ENCOUNTER — Ambulatory Visit (INDEPENDENT_AMBULATORY_CARE_PROVIDER_SITE_OTHER): Payer: BC Managed Care – PPO

## 2018-05-17 DIAGNOSIS — Z23 Encounter for immunization: Secondary | ICD-10-CM

## 2018-09-20 IMAGING — MG DIGITAL SCREENING BILATERAL MAMMOGRAM WITH CAD
5 series · 5 of 5 positions shown · non-contrast
Comparison: Previous exam(s).

CLINICAL DATA: Screening.

EXAM:
DIGITAL SCREENING BILATERAL MAMMOGRAM WITH CAD

[R CC]
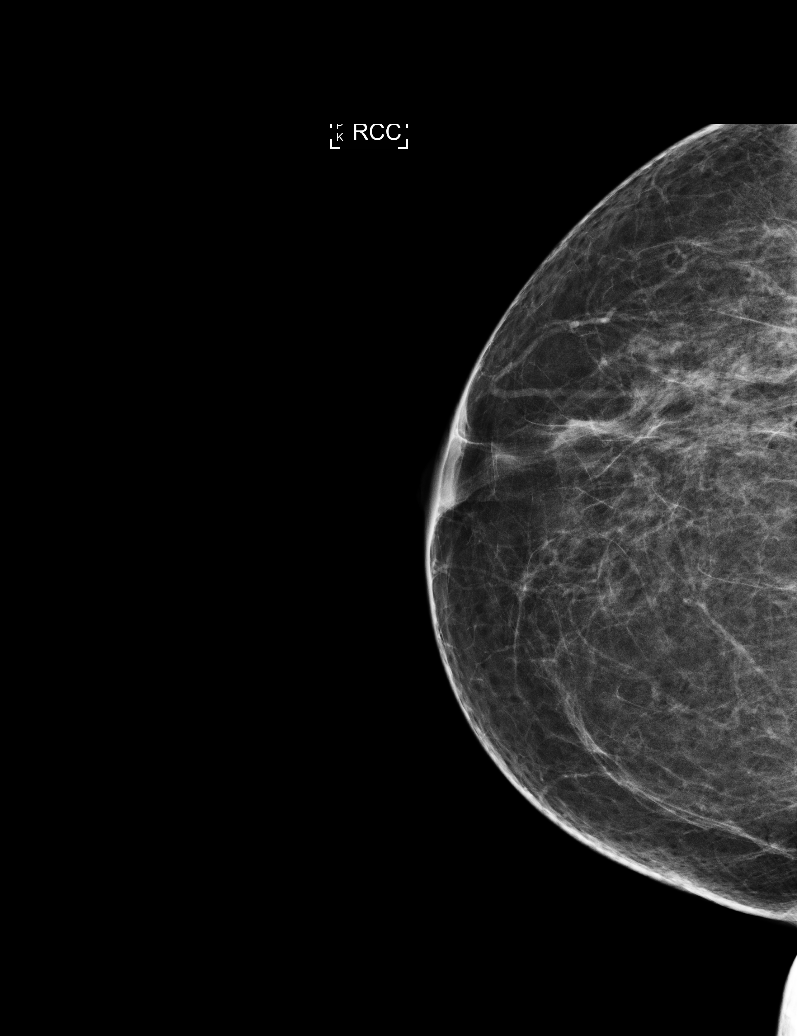

[L CC]
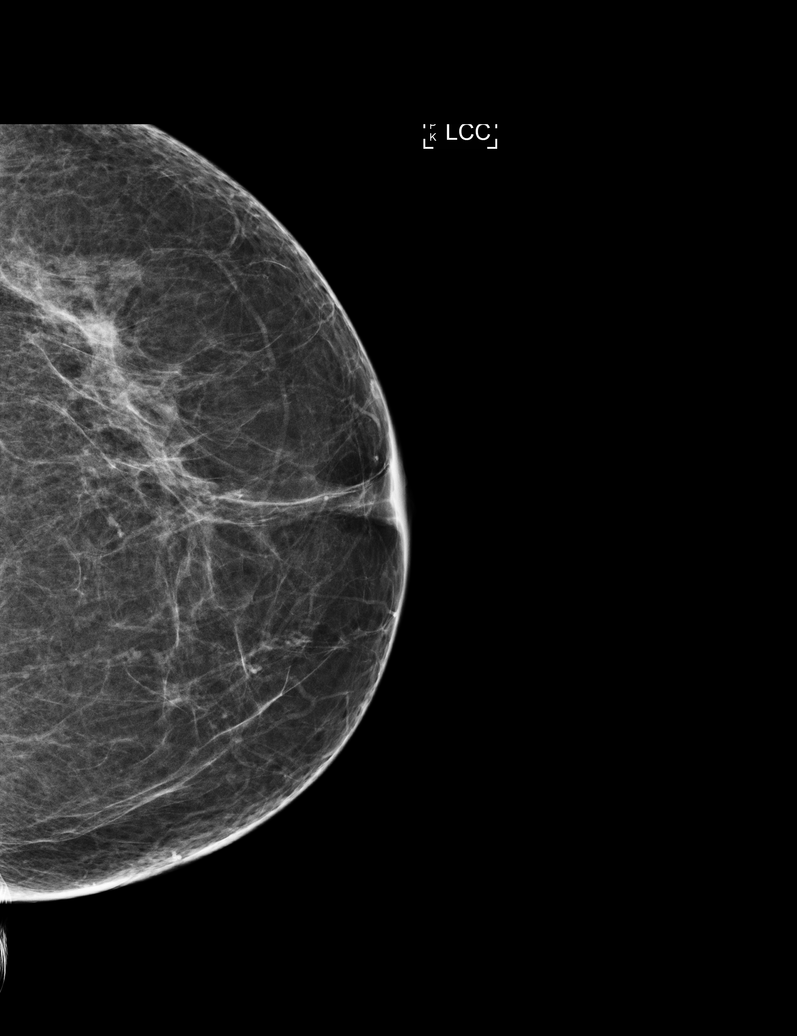

[L MLO]
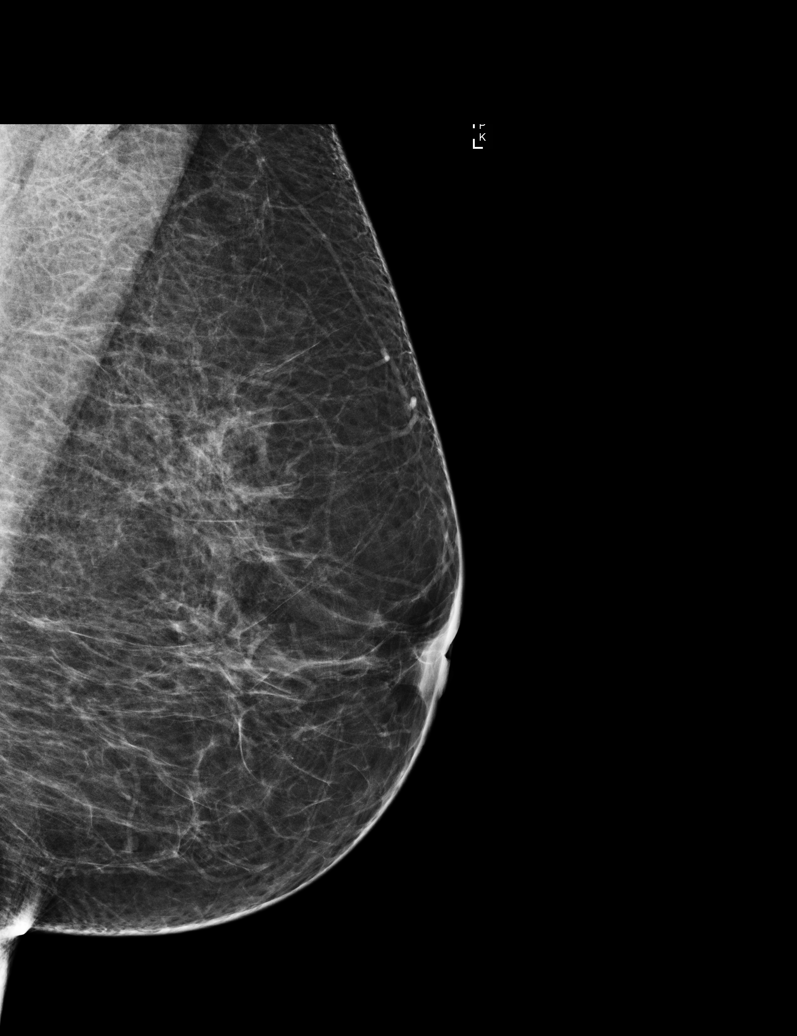

[R MLO (1 of 2)]
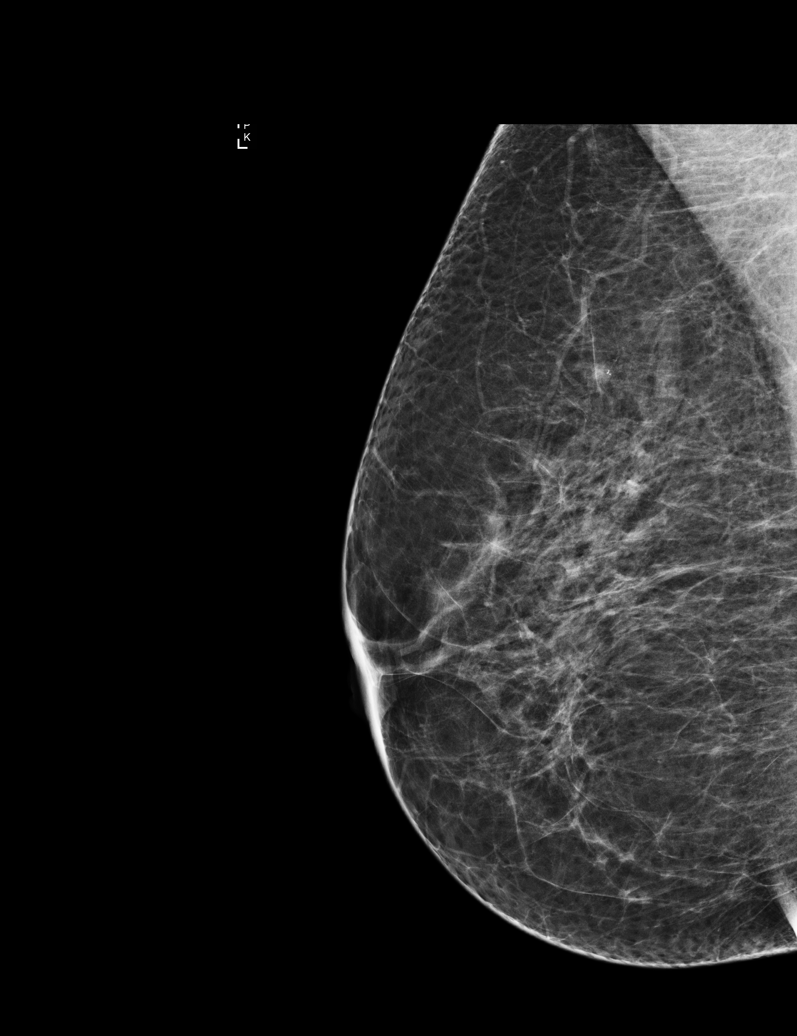

[R MLO (2 of 2)]
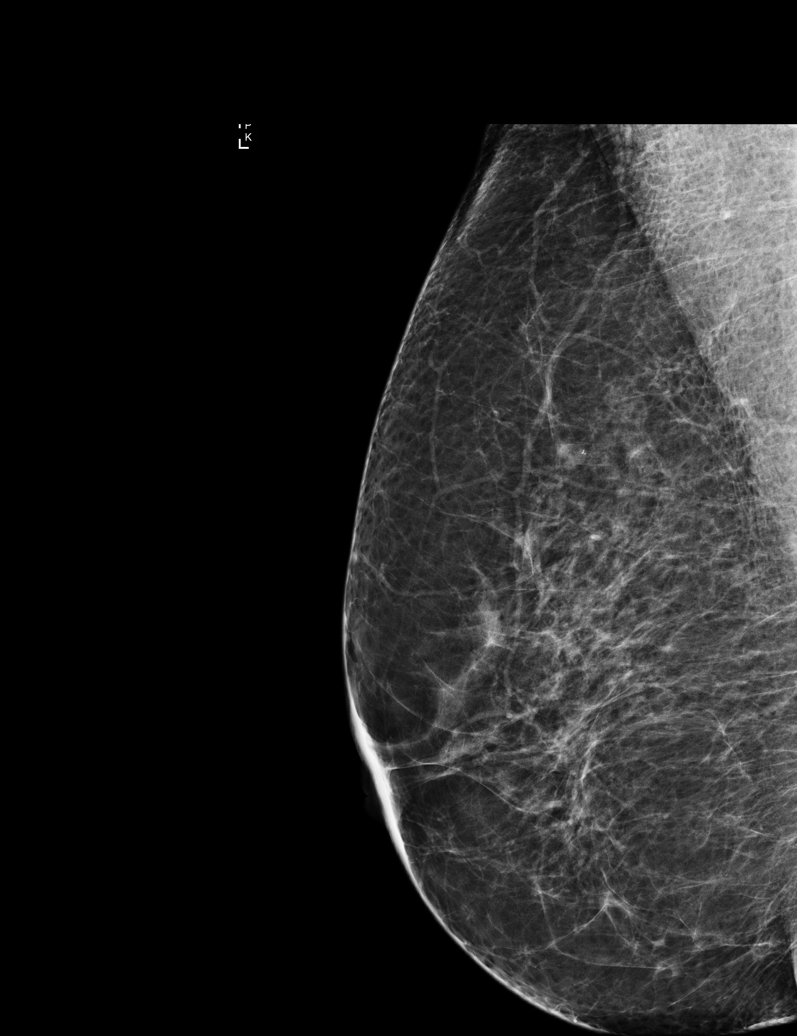

[5 of 5 positions shown; findings below may reference images not displayed]

ACR Breast Density Category b: There are scattered areas of
fibroglandular density.
FINDINGS: There are no findings suspicious for malignancy. Images were
processed with CAD.
IMPRESSION: No mammographic evidence of malignancy. A result letter of this
screening mammogram will be mailed directly to the patient.

RECOMMENDATION:
Screening mammogram in one year. (Code:AS-G-LCT)

BI-RADS CATEGORY  1: Negative.

## 2018-10-25 ENCOUNTER — Encounter: Payer: Self-pay | Admitting: Family Medicine

## 2018-10-25 ENCOUNTER — Other Ambulatory Visit: Payer: Self-pay

## 2018-10-25 ENCOUNTER — Ambulatory Visit (INDEPENDENT_AMBULATORY_CARE_PROVIDER_SITE_OTHER): Payer: Medicare Other | Admitting: Family Medicine

## 2018-10-25 VITALS — Temp 98.0°F | Wt 132.0 lb

## 2018-10-25 DIAGNOSIS — L247 Irritant contact dermatitis due to plants, except food: Secondary | ICD-10-CM | POA: Diagnosis not present

## 2018-10-25 MED ORDER — PREDNISONE 10 MG PO TABS
ORAL_TABLET | ORAL | 0 refills | Status: AC
Start: 2018-10-25 — End: ?

## 2018-10-25 NOTE — Progress Notes (Signed)
Virtual Visit via Video Note  I connected with Abigail Miller on 10/25/18 at  2:20 PM EDT by a video enabled telemedicine application and verified that I am speaking with the correct person using two identifiers.  Location: Patient: work Provider: office   I discussed the limitations of evaluation and management by telemedicine and the availability of in person appointments. The patient expressed understanding and agreed to proceed.  History of Present Illness: Pt was working in her daughters yard and broke out withpoison ivy Monday  she is taking benadryl with some relief but it is still extremely itchy.   Observations/Objective: Vitals:   10/25/18 1417  Temp: 98 F (36.7 C)    Pt has vesicular rash face, chest and arms/hands   Very itchy   Assessment and Plan: 1. Contact dermatitis and eczema due to plant con't benadryl pred taper called in  Pt did not want depomedrol injection today--- she said the pills normally do the trick - predniSONE (DELTASONE) 10 MG tablet; TAKE 3 TABLETS PO QD FOR 3 DAYS THEN TAKE 2 TABLETS PO QD FOR 3 DAYS THEN TAKE 1 TABLET PO QD FOR 3 DAYS THEN TAKE 1/2 TAB PO QD FOR 3 DAYS  Dispense: 20 tablet; Refill: 0   Follow Up Instructions:    I discussed the assessment and treatment plan with the patient. The patient was provided an opportunity to ask questions and all were answered. The patient agreed with the plan and demonstrated an understanding of the instructions.   The patient was advised to call back or seek an in-person evaluation if the symptoms worsen or if the condition fails to improve as anticipated.  I provided 15 minutes of non-face-to-face time during this encounter.   Donato Schultz, DO

## 2018-10-27 ENCOUNTER — Telehealth: Payer: Self-pay | Admitting: *Deleted

## 2018-10-27 ENCOUNTER — Ambulatory Visit: Payer: Self-pay

## 2018-10-27 NOTE — Telephone Encounter (Signed)
[facility]  She will call on Monday to give [place] a status.

## 2018-10-27 NOTE — Telephone Encounter (Signed)
I offered her an injection but she did not want it then Its too late now---  Because office is closed  Are they seeing pt in sat clinic? If yes she can be seen there otherwise urgent care is the only option until Monday/ tues

## 2018-10-27 NOTE — Telephone Encounter (Signed)
Summary: poison ivy    predniSONE (DELTASONE) 10 MG tablet [[id number]] Pt called and stated that medication does not seem to be helping. Pt states that she would like a call back because it seems to still be spreading. Pt would like a call back regarding.pt has poison ivy Please advise

## 2018-10-27 NOTE — Telephone Encounter (Signed)
Incoming  Call from Patient with a complaint complaint of Poison ivy.  Was seen this past Wednesday.  Prescribed of Prednisone.  Has been using  Hydrosone  Creme.  Has 9 more days left of prednisone.  Patient states that rash has spread to both hands and arms and legs Called the office discussed with Jasmine December, who dicussed with  Dr.  Zola Button.  Informed Patient that  Dr.Lowne- chase will call Patient  Patient back.  Patient reports severe itching.  Patient reports red rash with red blisters  Raised and not raised..     1  Abigail Miller Female, 68 y.o., 10-21-1950 MRN:  [id number] Phone:  [phone] (M) PCP:  Zola Button, Grayling Congress, DO Coverage:  [employer] Burke MEDICARE/[employer] MEDICARE Message from Fanny Bien sent at 10/27/2018 3:16 PM EDT   Summary: poison ivy    predniSONE (DELTASONE) 10 MG tablet [[id number]] Pt called and stated that medication does not seem to be helping. Pt states that she would like a call back because it seems to still be spreading. Pt would like a call back regarding.pt has poison ivy Please advise         Call History    Type Contact  10/27/2018 03:16 PM Phone (Incoming) Prescott Parma (Self)  Phone: [phone] (H)  User: Rogelia Rohrer sees red rash       1  Abigail Miller Female, 68 y.o., 03-31-1951 MRN:  [id number] Phone:  [phone] (M) PCP:  Zola Button, Grayling Congress, DO Coverage:  [employer] MEDICARE/[employer] MEDICARE Message from Fanny Bien sent at 10/27/2018 3:16 PM EDT   Summary: poison ivy    predniSONE (DELTASONE) 10 MG tablet [[id number]] Pt called and stated that medication does not seem to be helping. Pt states that she would like a call back because it seems to still be spreading. Pt would like a call back regarding.pt has poison ivy Please advise         Call History    Type Contact  10/27/2018 03:16 PM Phone (Incoming) Prescott Parma (Self)  Phone: [phone] (H)  User: Fanny Bien     Reason for Disposition . [1] Increasing redness around poison ivy AND [2] larger than 2 inches (5 cm) . [1] Severe poison ivy, oak, or sumac reaction in the past AND [2] face involved . SEVERE itching (e.g., interferes with sleep or normal activities)  Answer Assessment - Initial Assessment Questions 1. APPEARANCE of RASH: "Describe the rash."       Red blisters , small raised raiised blisster 2. LOCATION: "Where is the rash located?"      Hands arms 3. SIZE: "How large is the rash?"      Hands an arm alll coveeder 4. ONSET: "When did the rash begin?"       5. ITCHING: "Does the rash itch?" If so, ask: "How bad is it?"   - MILD - doesn't interfere with normal activities   - MODERATE - SEVERE: interferes with work, school, sleep, or other activities      Severe , awful 6. PREGNANCY: "Is there any chance you are pregnant?" "When was your last menstrual period?"     denies  Protocols used: POISON IVY - OAK - SUMAC-A-AH

## 2018-10-30 NOTE — Telephone Encounter (Signed)
Spoke with patient on Friday after closing. She was going to try and wait it out or go to urgent care.  She is to call today with status.

## 2018-10-30 NOTE — Telephone Encounter (Signed)
I has told her we could give her a depo medrol shot 80 mg if she like-- nurse visit  ---- or I can see her tomorrow

## 2018-10-30 NOTE — Telephone Encounter (Signed)
Please advise 

## 2019-01-12 ENCOUNTER — Other Ambulatory Visit: Payer: Self-pay | Admitting: Family Medicine

## 2019-01-12 DIAGNOSIS — Z1231 Encounter for screening mammogram for malignant neoplasm of breast: Secondary | ICD-10-CM

## 2019-02-28 ENCOUNTER — Ambulatory Visit
Admission: RE | Admit: 2019-02-28 | Discharge: 2019-02-28 | Disposition: A | Discharge status: Home / Self Care | Payer: Medicare Other | Source: Ambulatory Visit | Attending: Family Medicine | Admitting: Family Medicine

## 2019-02-28 ENCOUNTER — Other Ambulatory Visit: Payer: Self-pay

## 2019-02-28 DIAGNOSIS — Z1231 Encounter for screening mammogram for malignant neoplasm of breast: Secondary | ICD-10-CM

## 2019-04-17 ENCOUNTER — Other Ambulatory Visit: Payer: Self-pay

## 2019-04-17 ENCOUNTER — Ambulatory Visit (INDEPENDENT_AMBULATORY_CARE_PROVIDER_SITE_OTHER): Payer: Medicare Other

## 2019-04-17 DIAGNOSIS — Z23 Encounter for immunization: Secondary | ICD-10-CM | POA: Diagnosis not present

## 2019-08-01 ENCOUNTER — Other Ambulatory Visit: Payer: Self-pay

## 2019-08-01 ENCOUNTER — Ambulatory Visit: Payer: Medicare PPO | Admitting: Physician Assistant

## 2019-08-01 ENCOUNTER — Ambulatory Visit (HOSPITAL_COMMUNITY)
Admission: RE | Admit: 2019-08-01 | Discharge: 2019-08-01 | Disposition: A | Discharge status: Home / Self Care | Payer: Medicare PPO | Source: Ambulatory Visit | Attending: Vascular Surgery | Admitting: Vascular Surgery

## 2019-08-01 VITALS — BP 141/93 | HR 101 | Temp 97.5°F | Resp 14 | Ht 63.0 in | Wt 130.0 lb

## 2019-08-01 DIAGNOSIS — M79604 Pain in right leg: Secondary | ICD-10-CM | POA: Diagnosis not present

## 2019-08-01 DIAGNOSIS — M25552 Pain in left hip: Secondary | ICD-10-CM

## 2019-08-01 DIAGNOSIS — I872 Venous insufficiency (chronic) (peripheral): Secondary | ICD-10-CM

## 2019-08-01 DIAGNOSIS — M25551 Pain in right hip: Secondary | ICD-10-CM

## 2019-08-01 NOTE — Progress Notes (Signed)
[facility]           OF [place]  History and Physical   Abigail Miller is a 69 y.o. (1951-02-24) female who presents with hx of throbbing and aching of her right leg more than her left.   She had been seen in the past for similar sx by Dr. Arbie Cookey in 2009.    At that time, she did not have any evidence of GSV or SSV reflux on the right but a small amount of reflux in the left SSV.  There was no evidence of reflux in the deep system.     In March 2016, she did have sclerotherapy of the right leg.  She was given compression stockings at that time.  She states that she also her veins in her left leg injected here, but I cannot find that note.   She presents today with complaints of aching and throbbing in her right leg.  She states she does have some swelling.  She states that it is worse with sitting.  It seems better when she is up and about during the day.  She has not been wearing compression and does not elevate her legs higher than the coffee table.  She does not have any skin changes or non healing wounds.  She does have some small surface veins present bilaterally with right>left.   The pt does not have hx of DVT.   The pt does not have hx of varicose veins or ulcers.    There are not skin changes .     The pt is not on a statin for cholesterol management.  The pt is not on a daily aspirin.   Other AC:  none The pt is not on meds for hypertension.   The pt is not diabetic.   Tobacco hx:  never  Past Medical History:  Diagnosis Date  . No pertinent past medical history   . Vein, varicose     Past Surgical History:  Procedure Laterality Date  . CARPAL TUNNEL RELEASE  12/03/2011   Procedure: CARPAL TUNNEL RELEASE;  Surgeon: Wyn Forster., MD;  Location: [facility];  Service: Orthopedics;  Laterality: Right;  . CESAREAN SECTION     x3  . COLONOSCOPY    . DORSAL COMPARTMENT RELEASE  12/03/2011   Procedure: RELEASE DORSAL  COMPARTMENT (DEQUERVAIN);  Surgeon: Wyn Forster., MD;  Location: [facility];  Service: Orthopedics;  Laterality: Right;  1st dorsal compartment release  . TONSILLECTOMY  2000  . TRIGGER FINGER RELEASE  12/03/2011   Procedure: RELEASE TRIGGER FINGER/A-1 PULLEY;  Surgeon: Wyn Forster., MD;  Location: [facility];  Service: Orthopedics;  Laterality: Right;  right  thumb    Social History   Socioeconomic History  . Marital status: Single    Spouse name: Not on file  . Number of children: Not on file  . Years of education: Not on file  . Highest education level: Not on file  Occupational History    Employer: [employer]  Tobacco Use  . Smoking status: Never Smoker  . Smokeless tobacco: Never Used  Substance and Sexual Activity  . Alcohol use: No  . Drug use: No  . Sexual activity: Never    Partners: Male  Other Topics Concern  . Not on file  Social History Narrative   Exercise--gym and walking.   Social Determinants  of Health   Financial Resource Strain:   . Difficulty of Paying Living Expenses: Not on file  Food Insecurity:   . Worried About Programme researcher, broadcasting/film/video in the Last Year: Not on file  . Ran Out of Food in the Last Year: Not on file  Transportation Needs:   . Lack of Transportation (Medical): Not on file  . Lack of Transportation (Non-Medical): Not on file  Physical Activity:   . Days of Exercise per Week: Not on file  . Minutes of Exercise per Session: Not on file  Stress:   . Feeling of Stress : Not on file  Social Connections:   . Frequency of Communication with Friends and Family: Not on file  . Frequency of Social Gatherings with Friends and Family: Not on file  . Attends Religious Services: Not on file  . Active Member of Clubs or Organizations: Not on file  . Attends Banker Meetings: Not on file  . Marital Status: Not on file  Intimate Partner Violence:   . Fear of Current or Ex-Partner:  Not on file  . Emotionally Abused: Not on file  . Physically Abused: Not on file  . Sexually Abused: Not on file    Family History  Problem Relation Age of Onset  . Arthritis Mother   . Hyperlipidemia Mother   . Hypertension Mother   . Arthritis Father   . Cancer Father 26       lung  . Hyperlipidemia Father   . Breast cancer Sister 73  no family hx of AAA  Current Outpatient Medications  Medication Sig Dispense Refill  . predniSONE (DELTASONE) 10 MG tablet TAKE 3 TABLETS PO QD FOR 3 DAYS THEN TAKE 2 TABLETS PO QD FOR 3 DAYS THEN TAKE 1 TABLET PO QD FOR 3 DAYS THEN TAKE 1/2 TAB PO QD FOR 3 DAYS 20 tablet 0   No current facility-administered medications for this visit.    Allergies  Allergen Reactions  . Sulfa Antibiotics Rash    REVIEW OF SYSTEMS:   [X]  denotes positive finding, [ ]  denotes negative finding Cardiac  Comments:  Chest pain or chest pressure:    Shortness of breath upon exertion:    Short of breath when lying flat:    Irregular heart rhythm:        Vascular    Pain in calf, thigh, or hip brought on by ambulation:    Pain in feet at night that wakes you up from your sleep:     Blood clot in your veins:    Leg swelling:         Pulmonary    Oxygen at home:    Productive cough:     Wheezing:         Neurologic    Sudden weakness in arms or legs:     Sudden numbness in arms or legs:     Sudden onset of difficulty speaking or slurred speech:    Temporary loss of vision in one eye:     Problems with dizziness:         Gastrointestinal    Blood in stool:     Vomited blood:         Genitourinary    Burning when urinating:     Blood in urine:        Psychiatric    Major depression:         Hematologic    Bleeding problems:    Problems  with blood clotting too easily:        Skin    Rashes or ulcers:        Constitutional    Fever or chills:      PHYSICAL EXAMINATION:  Today's Vitals   08/01/19 1430  BP: (!) 141/93  Pulse: (!) 101   Resp: 14  Temp: (!) 97.5 F (36.4 C)  TempSrc: Temporal  SpO2: 99%  Weight: 130 lb (59 kg)  Height: 5\' 3"  (1.6 m)  PainSc: 8    Body mass index is 23.03 kg/m.   General:  WDWN in NAD; vital signs documented above Gait: Not observed HENT: WNL, normocephalic Pulmonary: normal non-labored breathing , without Rales, rhonchi,  wheezing Cardiac: regular HR; without carotid bruits Abdomen: soft, NT, no masses Skin: without rashes Vascular Exam/Pulses:  Right Left  Radial 2+ (normal) 2+ (normal)  Ulnar 2+ (normal) 2+ (normal)  DP 2+ (normal) 2+ (normal)  PT 2+ (normal) 2+ (normal)   Extremities:       Musculoskeletal: no muscle wasting or atrophy  Neurologic: A&O X 3;  No focal weakness or paresthesias are detected Psychiatric:  The pt has Normal affect.   Non-Invasive Vascular Imaging:   Venous duplex on 08/01/2019: Venous Reflux Times  +---------------------+---------+------+----------+-----------+------------  ----+  RIGHT        Reflux NoReflux Reflux  Diameter Comments                        Yes   Time    cms             +---------------------+---------+------+----------+-----------+------------  ----+  CFV         no                             +---------------------+---------+------+----------+-----------+------------  ----+  FV mid        no                             +---------------------+---------+------+----------+-----------+------------  ----+  Popliteal      no                             +---------------------+---------+------+----------+-----------+------------  ----+  GSV at SFJ      no                             +---------------------+---------+------+----------+-----------+------------  ----+  GSV prox thigh    no               0.21             +---------------------+---------+------+----------+-----------+------------  ----+  GSV mid thigh          yes        0.25             +---------------------+---------+------+----------+-----------+------------  ----+  GSV dist thigh    no              0.14             +---------------------+---------+------+----------+-----------+------------  ----+  GSV at knee     no              0.10             +---------------------+---------+------+----------+-----------+------------  ----+  SSV Pop Fossa  no                  chronic  thrombus  +---------------------+---------+------+----------+-----------+------------  ----+  Distal thigh           yes        0.37             extension                                    +---------------------+---------+------+----------+-----------+------------  ----+   Summary:  Right:  - No evidence of deep vein thrombosis seen in the right lower extremity.  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  - Venous reflux is noted in the right greater saphenous vein in the mid  thigh.  - No evidence of deep vein reflux.  - Evidence of an incompetent distal thigh extension.   Lashone Stauber is a 69 y.o. female who presents with: throbbing and achiness of RLE  Pt does 2 small areas of reflux in the GSV the mid thigh and the distal thigh extension, however, her vein is not big enough for laser ablation.  I did discuss with Dr. Earnestine Mealing bout the focal chronic thrombus and given this is chronic, would not anticoagulate pt.  She did not have evidence of deep system reflux. -talked with pt about compression stockings and how to elevate legs.  Advised to put stockings on prior to  getting out of bed then taking off before bed.   Also discussed with her that if she had access to swimming or water aerobics, this could also be helpful. -also discussed sclerotherapy, which she has had done in the past.  Will have RN call to discuss pricing and procedure with pt.  Pt is in agreement.  -otherwise, she will f/u prn.    Leontine Locket, [facility] 08/01/2019 2:22 PM  Clinic MD:  Oneida Alar

## 2019-11-26 ENCOUNTER — Telehealth: Payer: Self-pay

## 2019-11-26 NOTE — Telephone Encounter (Signed)
Called pt to f/u on scheduling sclerotherapy appt. She is interested in scheduling in fall. She has cluster on R lower leg/ankle area that she would like to see improvement with. She will call us back in fall when she is ready to set up an appt.

## 2020-01-11 DIAGNOSIS — D224 Melanocytic nevi of scalp and neck: Secondary | ICD-10-CM | POA: Diagnosis not present

## 2020-01-11 DIAGNOSIS — D2262 Melanocytic nevi of left upper limb, including shoulder: Secondary | ICD-10-CM | POA: Diagnosis not present

## 2020-01-11 DIAGNOSIS — D225 Melanocytic nevi of trunk: Secondary | ICD-10-CM | POA: Diagnosis not present

## 2020-01-11 DIAGNOSIS — C44319 Basal cell carcinoma of skin of other parts of face: Secondary | ICD-10-CM | POA: Diagnosis not present

## 2020-01-11 DIAGNOSIS — L821 Other seborrheic keratosis: Secondary | ICD-10-CM | POA: Diagnosis not present

## 2020-01-11 DIAGNOSIS — Z85828 Personal history of other malignant neoplasm of skin: Secondary | ICD-10-CM | POA: Diagnosis not present

## 2020-01-11 DIAGNOSIS — D1801 Hemangioma of skin and subcutaneous tissue: Secondary | ICD-10-CM | POA: Diagnosis not present

## 2020-01-11 DIAGNOSIS — D485 Neoplasm of uncertain behavior of skin: Secondary | ICD-10-CM | POA: Diagnosis not present

## 2020-01-11 DIAGNOSIS — D2221 Melanocytic nevi of right ear and external auricular canal: Secondary | ICD-10-CM | POA: Diagnosis not present

## 2020-01-11 DIAGNOSIS — L820 Inflamed seborrheic keratosis: Secondary | ICD-10-CM | POA: Diagnosis not present

## 2020-01-21 ENCOUNTER — Other Ambulatory Visit: Payer: Self-pay | Admitting: Family Medicine

## 2020-01-21 DIAGNOSIS — Z1231 Encounter for screening mammogram for malignant neoplasm of breast: Secondary | ICD-10-CM

## 2020-02-05 DIAGNOSIS — C44319 Basal cell carcinoma of skin of other parts of face: Secondary | ICD-10-CM | POA: Diagnosis not present

## 2020-02-05 DIAGNOSIS — Z85828 Personal history of other malignant neoplasm of skin: Secondary | ICD-10-CM | POA: Diagnosis not present

## 2020-02-29 ENCOUNTER — Other Ambulatory Visit: Payer: Self-pay

## 2020-02-29 ENCOUNTER — Ambulatory Visit
Admission: RE | Admit: 2020-02-29 | Discharge: 2020-02-29 | Disposition: A | Discharge status: Home / Self Care | Payer: Medicare PPO | Source: Ambulatory Visit | Attending: Family Medicine | Admitting: Family Medicine

## 2020-02-29 DIAGNOSIS — Z1231 Encounter for screening mammogram for malignant neoplasm of breast: Secondary | ICD-10-CM

## 2020-12-24 DIAGNOSIS — D225 Melanocytic nevi of trunk: Secondary | ICD-10-CM | POA: Diagnosis not present

## 2020-12-24 DIAGNOSIS — L821 Other seborrheic keratosis: Secondary | ICD-10-CM | POA: Diagnosis not present

## 2020-12-24 DIAGNOSIS — D1801 Hemangioma of skin and subcutaneous tissue: Secondary | ICD-10-CM | POA: Diagnosis not present

## 2020-12-24 DIAGNOSIS — Z85828 Personal history of other malignant neoplasm of skin: Secondary | ICD-10-CM | POA: Diagnosis not present

## 2020-12-24 DIAGNOSIS — D2262 Melanocytic nevi of left upper limb, including shoulder: Secondary | ICD-10-CM | POA: Diagnosis not present

## 2020-12-24 DIAGNOSIS — L820 Inflamed seborrheic keratosis: Secondary | ICD-10-CM | POA: Diagnosis not present

## 2021-01-28 ENCOUNTER — Other Ambulatory Visit: Payer: Self-pay | Admitting: Family Medicine

## 2021-01-28 DIAGNOSIS — Z1231 Encounter for screening mammogram for malignant neoplasm of breast: Secondary | ICD-10-CM

## 2021-02-02 DIAGNOSIS — D2239 Melanocytic nevi of other parts of face: Secondary | ICD-10-CM | POA: Diagnosis not present

## 2021-02-02 DIAGNOSIS — D485 Neoplasm of uncertain behavior of skin: Secondary | ICD-10-CM | POA: Diagnosis not present

## 2021-02-02 DIAGNOSIS — Z85828 Personal history of other malignant neoplasm of skin: Secondary | ICD-10-CM | POA: Diagnosis not present

## 2021-03-18 ENCOUNTER — Other Ambulatory Visit: Payer: Self-pay

## 2021-03-18 ENCOUNTER — Ambulatory Visit
Admission: RE | Admit: 2021-03-18 | Discharge: 2021-03-18 | Disposition: A | Discharge status: Home / Self Care | Payer: Medicare PPO | Source: Ambulatory Visit | Attending: Family Medicine | Admitting: Family Medicine

## 2021-03-18 DIAGNOSIS — Z1231 Encounter for screening mammogram for malignant neoplasm of breast: Secondary | ICD-10-CM

## 2021-12-02 DIAGNOSIS — L738 Other specified follicular disorders: Secondary | ICD-10-CM | POA: Diagnosis not present

## 2021-12-02 DIAGNOSIS — L814 Other melanin hyperpigmentation: Secondary | ICD-10-CM | POA: Diagnosis not present

## 2021-12-02 DIAGNOSIS — D2221 Melanocytic nevi of right ear and external auricular canal: Secondary | ICD-10-CM | POA: Diagnosis not present

## 2021-12-02 DIAGNOSIS — D2239 Melanocytic nevi of other parts of face: Secondary | ICD-10-CM | POA: Diagnosis not present

## 2021-12-02 DIAGNOSIS — L821 Other seborrheic keratosis: Secondary | ICD-10-CM | POA: Diagnosis not present

## 2021-12-02 DIAGNOSIS — Z85828 Personal history of other malignant neoplasm of skin: Secondary | ICD-10-CM | POA: Diagnosis not present

## 2021-12-02 DIAGNOSIS — D1801 Hemangioma of skin and subcutaneous tissue: Secondary | ICD-10-CM | POA: Diagnosis not present

## 2021-12-02 DIAGNOSIS — D225 Melanocytic nevi of trunk: Secondary | ICD-10-CM | POA: Diagnosis not present

## 2022-02-08 ENCOUNTER — Other Ambulatory Visit: Payer: Self-pay | Admitting: Family Medicine

## 2022-02-08 DIAGNOSIS — Z1231 Encounter for screening mammogram for malignant neoplasm of breast: Secondary | ICD-10-CM

## 2022-03-19 ENCOUNTER — Ambulatory Visit: Payer: Medicare PPO

## 2022-03-22 ENCOUNTER — Ambulatory Visit
Admission: RE | Admit: 2022-03-22 | Discharge: 2022-03-22 | Disposition: A | Discharge status: Home / Self Care | Payer: Medicare PPO | Source: Ambulatory Visit | Attending: Family Medicine | Admitting: Family Medicine

## 2022-03-22 DIAGNOSIS — Z1231 Encounter for screening mammogram for malignant neoplasm of breast: Secondary | ICD-10-CM

## 2022-07-15 ENCOUNTER — Telehealth: Payer: Self-pay | Admitting: Family Medicine

## 2022-07-15 NOTE — Telephone Encounter (Signed)
LVM to schedule preventative visit 

## 2023-02-07 DIAGNOSIS — L821 Other seborrheic keratosis: Secondary | ICD-10-CM | POA: Diagnosis not present

## 2023-02-07 DIAGNOSIS — D2262 Melanocytic nevi of left upper limb, including shoulder: Secondary | ICD-10-CM | POA: Diagnosis not present

## 2023-02-07 DIAGNOSIS — L820 Inflamed seborrheic keratosis: Secondary | ICD-10-CM | POA: Diagnosis not present

## 2023-02-07 DIAGNOSIS — D2221 Melanocytic nevi of right ear and external auricular canal: Secondary | ICD-10-CM | POA: Diagnosis not present

## 2023-02-07 DIAGNOSIS — Z85828 Personal history of other malignant neoplasm of skin: Secondary | ICD-10-CM | POA: Diagnosis not present

## 2023-02-07 DIAGNOSIS — L814 Other melanin hyperpigmentation: Secondary | ICD-10-CM | POA: Diagnosis not present

## 2023-02-07 DIAGNOSIS — D225 Melanocytic nevi of trunk: Secondary | ICD-10-CM | POA: Diagnosis not present

## 2023-02-15 ENCOUNTER — Other Ambulatory Visit: Payer: Self-pay | Admitting: Family Medicine

## 2023-02-15 DIAGNOSIS — Z1231 Encounter for screening mammogram for malignant neoplasm of breast: Secondary | ICD-10-CM

## 2023-03-24 ENCOUNTER — Ambulatory Visit
Admission: RE | Admit: 2023-03-24 | Discharge: 2023-03-24 | Disposition: A | Discharge status: Home / Self Care | Payer: Medicare PPO | Source: Ambulatory Visit | Attending: Family Medicine | Admitting: Family Medicine

## 2023-03-24 DIAGNOSIS — Z1231 Encounter for screening mammogram for malignant neoplasm of breast: Secondary | ICD-10-CM | POA: Diagnosis not present

## 2023-10-25 DIAGNOSIS — H2513 Age-related nuclear cataract, bilateral: Secondary | ICD-10-CM | POA: Diagnosis not present

## 2024-02-21 ENCOUNTER — Other Ambulatory Visit: Payer: Self-pay | Admitting: Family Medicine

## 2024-02-21 DIAGNOSIS — Z1231 Encounter for screening mammogram for malignant neoplasm of breast: Secondary | ICD-10-CM

## 2024-03-12 DIAGNOSIS — D225 Melanocytic nevi of trunk: Secondary | ICD-10-CM | POA: Diagnosis not present

## 2024-03-12 DIAGNOSIS — L821 Other seborrheic keratosis: Secondary | ICD-10-CM | POA: Diagnosis not present

## 2024-03-12 DIAGNOSIS — L814 Other melanin hyperpigmentation: Secondary | ICD-10-CM | POA: Diagnosis not present

## 2024-03-12 DIAGNOSIS — D2262 Melanocytic nevi of left upper limb, including shoulder: Secondary | ICD-10-CM | POA: Diagnosis not present

## 2024-03-12 DIAGNOSIS — Z85828 Personal history of other malignant neoplasm of skin: Secondary | ICD-10-CM | POA: Diagnosis not present

## 2024-03-12 DIAGNOSIS — D2261 Melanocytic nevi of right upper limb, including shoulder: Secondary | ICD-10-CM | POA: Diagnosis not present

## 2024-03-12 DIAGNOSIS — L820 Inflamed seborrheic keratosis: Secondary | ICD-10-CM | POA: Diagnosis not present

## 2024-03-26 ENCOUNTER — Ambulatory Visit
Admission: RE | Admit: 2024-03-26 | Discharge: 2024-03-26 | Disposition: A | Discharge status: Home / Self Care | Source: Ambulatory Visit | Attending: Family Medicine | Admitting: Family Medicine

## 2024-03-26 DIAGNOSIS — Z1231 Encounter for screening mammogram for malignant neoplasm of breast: Secondary | ICD-10-CM | POA: Diagnosis not present
